# Patient Record
Sex: Male | Born: 1935 | Hispanic: Yes | Marital: Single | Smoking: Former smoker
Health system: Southern US, Community
[De-identification: ages and names within clinical notes are randomized; demographics above are authoritative.]

## PROBLEM LIST (undated history)

## (undated) DIAGNOSIS — K56609 Unspecified intestinal obstruction, unspecified as to partial versus complete obstruction: Secondary | ICD-10-CM

## (undated) DIAGNOSIS — H409 Unspecified glaucoma: Secondary | ICD-10-CM

## (undated) DIAGNOSIS — N183 Chronic kidney disease, stage 3 unspecified: Secondary | ICD-10-CM

## (undated) HISTORY — PX: PROSTATE SURGERY: SHX751

## (undated) HISTORY — PX: CHOLECYSTECTOMY: SHX55

---

## 2006-10-08 ENCOUNTER — Ambulatory Visit: Payer: Self-pay | Admitting: Family Medicine

## 2009-01-25 ENCOUNTER — Emergency Department: Payer: Self-pay | Admitting: Emergency Medicine

## 2015-10-11 ENCOUNTER — Inpatient Hospital Stay: Payer: Medicare Other

## 2015-10-11 ENCOUNTER — Inpatient Hospital Stay
Admission: EM | Admit: 2015-10-11 | Discharge: 2015-10-14 | DRG: 444 | Disposition: A | Discharge status: Home Health | Payer: Medicare Other | Attending: Internal Medicine | Admitting: Internal Medicine

## 2015-10-11 ENCOUNTER — Encounter: Payer: Self-pay | Admitting: Emergency Medicine

## 2015-10-11 ENCOUNTER — Emergency Department: Payer: Medicare Other

## 2015-10-11 DIAGNOSIS — B179 Acute viral hepatitis, unspecified: Secondary | ICD-10-CM | POA: Diagnosis present

## 2015-10-11 DIAGNOSIS — R935 Abnormal findings on diagnostic imaging of other abdominal regions, including retroperitoneum: Secondary | ICD-10-CM

## 2015-10-11 DIAGNOSIS — N289 Disorder of kidney and ureter, unspecified: Secondary | ICD-10-CM

## 2015-10-11 DIAGNOSIS — K839 Disease of biliary tract, unspecified: Secondary | ICD-10-CM | POA: Insufficient documentation

## 2015-10-11 DIAGNOSIS — R112 Nausea with vomiting, unspecified: Secondary | ICD-10-CM

## 2015-10-11 DIAGNOSIS — R748 Abnormal levels of other serum enzymes: Secondary | ICD-10-CM | POA: Diagnosis not present

## 2015-10-11 DIAGNOSIS — K269 Duodenal ulcer, unspecified as acute or chronic, without hemorrhage or perforation: Secondary | ICD-10-CM

## 2015-10-11 DIAGNOSIS — K859 Acute pancreatitis without necrosis or infection, unspecified: Secondary | ICD-10-CM | POA: Insufficient documentation

## 2015-10-11 DIAGNOSIS — A498 Other bacterial infections of unspecified site: Secondary | ICD-10-CM | POA: Diagnosis present

## 2015-10-11 DIAGNOSIS — Z9049 Acquired absence of other specified parts of digestive tract: Secondary | ICD-10-CM

## 2015-10-11 DIAGNOSIS — K8050 Calculus of bile duct without cholangitis or cholecystitis without obstruction: Principal | ICD-10-CM | POA: Diagnosis present

## 2015-10-11 DIAGNOSIS — R1013 Epigastric pain: Secondary | ICD-10-CM | POA: Insufficient documentation

## 2015-10-11 DIAGNOSIS — A419 Sepsis, unspecified organism: Secondary | ICD-10-CM | POA: Diagnosis present

## 2015-10-11 DIAGNOSIS — R109 Unspecified abdominal pain: Secondary | ICD-10-CM | POA: Diagnosis present

## 2015-10-11 DIAGNOSIS — Z8249 Family history of ischemic heart disease and other diseases of the circulatory system: Secondary | ICD-10-CM

## 2015-10-11 DIAGNOSIS — N179 Acute kidney failure, unspecified: Secondary | ICD-10-CM | POA: Diagnosis present

## 2015-10-11 HISTORY — DX: Unspecified intestinal obstruction, unspecified as to partial versus complete obstruction: K56.609

## 2015-10-11 LAB — CBC WITH DIFFERENTIAL/PLATELET
Basophils Absolute: 0 10*3/uL (ref 0–0.1)
Basophils Relative: 0 %
EOS ABS: 0 10*3/uL (ref 0–0.7)
EOS PCT: 0 %
HEMATOCRIT: 43.3 % (ref 40.0–52.0)
HEMOGLOBIN: 14.4 g/dL (ref 13.0–18.0)
Lymphocytes Relative: 1 %
Lymphs Abs: 0.2 10*3/uL — ABNORMAL LOW (ref 1.0–3.6)
MCH: 30.7 pg (ref 26.0–34.0)
MCHC: 33.2 g/dL (ref 32.0–36.0)
MCV: 92.5 fL (ref 80.0–100.0)
MONO ABS: 0.2 10*3/uL (ref 0.2–1.0)
Monocytes Relative: 2 %
Neutro Abs: 11.8 10*3/uL — ABNORMAL HIGH (ref 1.4–6.5)
Neutrophils Relative %: 97 %
Platelets: 199 10*3/uL (ref 150–440)
RBC: 4.68 MIL/uL (ref 4.40–5.90)
RDW: 14.2 % (ref 11.5–14.5)
WBC: 12.2 10*3/uL — ABNORMAL HIGH (ref 3.8–10.6)

## 2015-10-11 LAB — TSH: TSH: 1.798 u[IU]/mL (ref 0.350–4.500)

## 2015-10-11 LAB — COMPREHENSIVE METABOLIC PANEL
ALK PHOS: 534 U/L — AB (ref 38–126)
ALT: 241 U/L — AB (ref 17–63)
ANION GAP: 9 (ref 5–15)
AST: 288 U/L — ABNORMAL HIGH (ref 15–41)
Albumin: 3.5 g/dL (ref 3.5–5.0)
BILIRUBIN TOTAL: 3 mg/dL — AB (ref 0.3–1.2)
BUN: 40 mg/dL — ABNORMAL HIGH (ref 6–20)
CALCIUM: 8.9 mg/dL (ref 8.9–10.3)
CO2: 23 mmol/L (ref 22–32)
CREATININE: 2.22 mg/dL — AB (ref 0.61–1.24)
Chloride: 109 mmol/L (ref 101–111)
GFR, EST AFRICAN AMERICAN: 31 mL/min — AB (ref >60)
GFR, EST NON AFRICAN AMERICAN: 27 mL/min — AB (ref >60)
Glucose, Bld: 125 mg/dL — ABNORMAL HIGH (ref 65–99)
Potassium: 4 mmol/L (ref 3.5–5.1)
SODIUM: 141 mmol/L (ref 135–145)
TOTAL PROTEIN: 7.7 g/dL (ref 6.5–8.1)

## 2015-10-11 LAB — URINALYSIS COMPLETE WITH MICROSCOPIC (ARMC ONLY)
Bacteria, UA: NONE SEEN
Bilirubin Urine: NEGATIVE
GLUCOSE, UA: NEGATIVE mg/dL
KETONES UR: NEGATIVE mg/dL
Leukocytes, UA: NEGATIVE
Nitrite: NEGATIVE
PROTEIN: 30 mg/dL — AB
SPECIFIC GRAVITY, URINE: 1.011 (ref 1.005–1.030)
SQUAMOUS EPITHELIAL / LPF: NONE SEEN
pH: 5 (ref 5.0–8.0)

## 2015-10-11 LAB — HEMOGLOBIN A1C: Hgb A1c MFr Bld: 5.3 % (ref 4.0–6.0)

## 2015-10-11 LAB — GAMMA GT: GGT: 827 U/L — ABNORMAL HIGH (ref 7–50)

## 2015-10-11 LAB — TROPONIN I

## 2015-10-11 MED ORDER — MORPHINE SULFATE (PF) 4 MG/ML IV SOLN
4.0000 mg | INTRAVENOUS | Status: DC | PRN
  Administered 2015-10-11: 4 mg via INTRAVENOUS
  Filled 2015-10-11: qty 1

## 2015-10-11 MED ORDER — SODIUM CHLORIDE 0.9 % IV BOLUS (SEPSIS)
1000.0000 mL | Freq: Once | INTRAVENOUS | Status: AC
  Administered 2015-10-11: 1000 mL via INTRAVENOUS

## 2015-10-11 MED ORDER — DEXTROSE 5 % IV SOLN
1.0000 g | Freq: Once | INTRAVENOUS | Status: AC
  Administered 2015-10-11: 1 g via INTRAVENOUS
  Filled 2015-10-11: qty 10

## 2015-10-11 MED ORDER — ONDANSETRON HCL 4 MG/2ML IJ SOLN
INTRAMUSCULAR | Status: AC
  Administered 2015-10-11: 4 mg via INTRAVENOUS
  Filled 2015-10-11: qty 2

## 2015-10-11 MED ORDER — MORPHINE SULFATE (PF) 2 MG/ML IV SOLN
INTRAVENOUS | Status: AC
  Administered 2015-10-11: 2 mg via INTRAVENOUS
  Filled 2015-10-11: qty 1

## 2015-10-11 MED ORDER — SODIUM CHLORIDE 0.9 % IJ SOLN
3.0000 mL | Freq: Two times a day (BID) | INTRAMUSCULAR | Status: DC
  Administered 2015-10-13: 3 mL via INTRAVENOUS

## 2015-10-11 MED ORDER — SODIUM CHLORIDE 0.9 % IV SOLN
INTRAVENOUS | Status: DC
  Administered 2015-10-11: 11:00:00 via INTRAVENOUS
  Administered 2015-10-11: 1000 mL via INTRAVENOUS
  Administered 2015-10-12 – 2015-10-13 (×5): via INTRAVENOUS

## 2015-10-11 MED ORDER — DOCUSATE SODIUM 100 MG PO CAPS
100.0000 mg | ORAL_CAPSULE | Freq: Two times a day (BID) | ORAL | Status: DC
  Administered 2015-10-11 – 2015-10-14 (×5): 100 mg via ORAL
  Filled 2015-10-11 (×5): qty 1

## 2015-10-11 MED ORDER — ONDANSETRON HCL 4 MG/2ML IJ SOLN
4.0000 mg | Freq: Four times a day (QID) | INTRAMUSCULAR | Status: DC | PRN
  Administered 2015-10-13: 4 mg via INTRAVENOUS
  Filled 2015-10-11: qty 2

## 2015-10-11 MED ORDER — DEXTROSE 5 % IV SOLN
2.0000 g | INTRAVENOUS | Status: DC
  Administered 2015-10-12: 2 g via INTRAVENOUS
  Filled 2015-10-11 (×2): qty 2

## 2015-10-11 MED ORDER — MORPHINE SULFATE (PF) 2 MG/ML IV SOLN
2.0000 mg | Freq: Once | INTRAVENOUS | Status: AC
Start: 2015-10-11 — End: 2015-10-11
  Administered 2015-10-11: 2 mg via INTRAVENOUS

## 2015-10-11 MED ORDER — HEPARIN SODIUM (PORCINE) 5000 UNIT/ML IJ SOLN
5000.0000 [IU] | Freq: Three times a day (TID) | INTRAMUSCULAR | Status: DC
  Administered 2015-10-11: 5000 [IU] via SUBCUTANEOUS
  Filled 2015-10-11: qty 1

## 2015-10-11 MED ORDER — DEXTROSE 5 % IV SOLN
1.0000 g | INTRAVENOUS | Status: DC
  Administered 2015-10-11: 1 g via INTRAVENOUS
  Filled 2015-10-11: qty 10

## 2015-10-11 MED ORDER — ONDANSETRON HCL 4 MG/2ML IJ SOLN
4.0000 mg | Freq: Once | INTRAMUSCULAR | Status: AC
  Administered 2015-10-11: 4 mg via INTRAVENOUS

## 2015-10-11 MED ORDER — ONDANSETRON HCL 4 MG PO TABS: 4.0000 mg | ORAL_TABLET | Freq: Four times a day (QID) | ORAL | Status: DC | PRN

## 2015-10-11 NOTE — Consult Note (Signed)
[facility]  [address] [place], [place] [address] Phone: [phone] Fax : [phone]  Consultation  Referring Provider:     No ref. provider found Primary Care Physician:  Imelda PillowHOLLAND, CHELSA, NP Primary Gastroenterologist:  None         Reason for Consultation:     Epigastric pain with increased liver enzymes and pancreatic enzymes  Date of Admission:  10/11/2015 Date of Consultation:  10/11/2015         HPI:   William Holden is a 80 y.o. male who comes in with epigastric pain with nausea vomiting just prior to admission. The patient was found to have increased liver enzymes and increased lipase. The patient's lipase was 823 with the patient's liver enzymes showing a bilirubin of 3 with his AST higher than his ALT. The patient was asked if he had a history of drinking and he reported very little drinking but the wife interjected and states that it was more than a little. The patient then pushed back and the wife states that he was left alone for many years without a family while they were back in [place] and at that time he may have been drinking more than usual. The patient had signs of liver disease with possible portal hypertension on the CT scan. The patient has been having the pain off and on his epigastric region but less than yesterday. He also was put on pancreatic enzymes by his primary care physician which he thought was due to help his stomach. He denies a history of pancreatic insufficiency despite being on pancreas enzymes.  Past Medical History  Diagnosis Date  . Bowel obstruction [facility](HCC)     Past Surgical History  Procedure Laterality Date  . Cholecystectomy      Prior to Admission medications   Not on File    Family History  Problem Relation Age of Onset  . Hypertension Other      Social History  Substance Use Topics  . Smoking status: Never Smoker   . Smokeless tobacco: None  . Alcohol Use: No    Allergies as of 10/11/2015  .  (No Known Allergies)    Review of Systems:    All systems reviewed and negative except where noted in HPI.   Physical Exam:  Vital signs in last 24 hours: Temp:  [98.4 F (36.9 C)-99.8 F (37.7 C)] 98.4 F (36.9 C) (12/03 1012) Pulse Rate:  [85-101] 85 (12/03 1012) Resp:  [23-27] 23 (12/03 0700) BP: (111-123)/(64-87) 111/64 mmHg (12/03 1012) SpO2:  [93 %-95 %] 95 % (12/03 1012) Weight:  [130 lb (58.968 kg)] 130 lb (58.968 kg) (12/03 0646)   General:   Pleasant, cooperative in NAD Head:  Normocephalic and atraumatic. Eyes:   No icterus.   Conjunctiva pink. PERRLA. Ears:  Normal auditory acuity. Neck:  Supple; no masses or thyroidomegaly Lungs: Respirations even and unlabored. Lungs clear to auscultation bilaterally.   No wheezes, crackles, or rhonchi.  Heart:  Regular rate and rhythm;  Without murmur, clicks, rubs or gallops Abdomen:  Soft, nondistended, mild epigastric tenderness. Normal bowel sounds. No appreciable masses or hepatomegaly.  No rebound or guarding.  Rectal:  Not performed. Msk:  Symmetrical without gross deformities.   Extremities:  Without edema, cyanosis or clubbing. Neurologic:  Alert and oriented x3;  grossly normal neurologically. Skin:  Intact without significant lesions or rashes. Cervical Nodes:  No significant cervical adenopathy. Psych:  Alert and cooperative. Normal affect.  LAB RESULTS:  Recent  Labs  10/11/15 0516  WBC 12.2*  HGB 14.4  HCT 43.3  PLT 199   BMET  Recent Labs  10/11/15 0516  NA 141  K 4.0  CL 109  CO2 23  GLUCOSE 125*  BUN 40*  CREATININE 2.22*  CALCIUM 8.9   LFT  Recent Labs  10/11/15 0516  PROT 7.7  ALBUMIN 3.5  AST 288*  ALT 241*  ALKPHOS 534*  BILITOT 3.0*   PT/INR No results for input(s): LABPROT, INR in the last 72 hours.  STUDIES: US Abdomen Complete  10/11/2015  CLINICAL DATA:  Abdominal pain and pancreatitis with elevated lipase level. Abnormal liver function tests. EXAM: ULTRASOUND ABDOMEN  COMPLETE COMPARISON:  None. FINDINGS: Gallbladder: The gallbladder is not visualized by ultrasound and is either severely contracted or previously surgically removed. Common bile duct: Diameter: Visualize segment of the common bile duct is at the upper limits of normal in caliber measuring 7 mm. The distal common bile duct is obscured by ultrasound. Liver: The liver parenchyma is very heterogeneous with suggestion of mild intrahepatic biliary ductal dilatation as well as diffuse periportal echogenicity likely reflecting inflammation. Differential considerations include some type of acute hepatitis. There could also be a component of cholangitis. However, the degree of biliary ductal dilatation is only minimal by ultrasound. Some degree of portal hypertension may also be present as there appear to be slow flow in the main portal vein by real-time imaging. The portal vein is open and demonstrates flow. IVC: No abnormality visualized. Pancreas: Visualized portion unremarkable. Spleen: Size and appearance within normal limits. Right Kidney: Length: 10.7 cm. Echogenicity within normal limits. No mass or hydronephrosis visualized. Small cysts appear benign. The largest measures 1.4 cm. Left Kidney: Length: 10.1 cm. Echogenicity within normal limits. No mass or hydronephrosis visualized. Medial cystic structure measures 3.3 x 1.8 x 2.5 cm and likely represents a benign cyst. Abdominal aorta: No aneurysm visualized. Other findings: No ascites visualized. IMPRESSION: 1. Nonvisualization of the gallbladder by ultrasound. The gallbladder has either been previously removed or is severely contracted. 2. Very abnormal appearance of the liver parenchyma by ultrasound with pattern suggestive of diffuse periportal edema and inflammation as well as mild biliary ductal dilatation. Findings may be consistent with acute hepatitis versus cholangitis. The degree of biliary ductal dilatation is mild. There may be underlying portal  hypertension as well due to visible slow flow in the main portal vein by real-time imaging. No associated splenomegaly or visualized ascites. Electronically Signed   By: Irish Lack M.D.   On: 10/11/2015 10:27   Dg Abd Acute W/chest  10/11/2015  CLINICAL DATA:  Abdominal pain and vomiting, duration 1 hour. EXAM: DG ABDOMEN ACUTE W/ 1V CHEST COMPARISON:  None. FINDINGS: The abdominal gas pattern negative for obstruction perforation. No biliary or urinary calculi are evident. The upright view the chest demonstrates mild atelectatic appearing basilar lung opacities bilaterally. IMPRESSION: Negative for obstruction or perforation. Atelectatic appearing lung base opacities bilaterally. Electronically Signed   By: Ellery Plunk M.D.   On: 10/11/2015 06:46      Impression / Plan:   William Holden is a 80 y.o. y/o male with increased liver enzymes and increased lipase. The patient also had a mildly dilated common bile duct. With this and they possible history of alcohol abuse the patient may have acute on chronic liver disease versus a possible common bile duct stone. The patient will have his labs checked tomorrow and will also have a MRCP to evaluate his common  bile duct for possible biliary stone. The patient and the family have been explained all of this through an interpreter. They state they understand the plan and agree with it.   Thank you for involving me in the care of this patient.      LOS: 0 days   Darlina Rumpf, MD  10/11/2015, 1:25 PM   Note: This dictation was prepared with Dragon dictation along with smaller phrase technology. Any transcriptional errors that result from this process are unintentional.

## 2015-10-11 NOTE — ED Provider Notes (Signed)
[facility] Emergency Department Provider Note  ____________________________________________  Time seen: Approximately 4:57 AM  I have reviewed the triage vital signs and the nursing notes.   HISTORY  Chief Complaint Abdominal Pain and Emesis  History obtained via Spanish interpreter  HPI William Holden is a 80 y.o. male who presents to the ED from home with a chief complaint of abdominal pain, nausea and vomiting. Patient states onset of epigastric abdominal pain associated with nausea and vomiting which started approximately 1 hour ago. Family states he was treated 1 month ago in [place]; "they cleaned his intestines with a tube in his mouth".Per their report, patient did not require open surgery at that time. Patient denies associated symptoms of fever, chills, chest pain, shortness of breath, diarrhea, dysuria. States last bowel movement last evening which was normal for patient. The makes his symptoms better or worse. Other than that, history is limited because family does not know.   Past Medical History  Diagnosis Date  . Bowel obstruction (HCC)     There are no active problems to display for this patient.   Past surgical history Cholecystectomy   No current outpatient prescriptions on file.  Allergies Review of patient's allergies indicates no known allergies.  No family history on file.  Social History Social History  Substance Use Topics  . Smoking status: Never Smoker   . Smokeless tobacco: None  . Alcohol Use: No    Review of Systems Constitutional: No fever/chills Eyes: No visual changes. ENT: No sore throat. Cardiovascular: Denies chest pain. Respiratory: Denies shortness of breath. Gastrointestinal: Positive for abdominal pain.  Positive for nausea and vomiting.  No diarrhea.  No constipation. Genitourinary: Negative for dysuria. Musculoskeletal: Negative for back pain. Skin: Negative for rash. Neurological: Negative  for headaches, focal weakness or numbness.  10-point ROS otherwise negative.  ____________________________________________   PHYSICAL EXAM:  VITAL SIGNS: ED Triage Vitals  Enc Vitals Group     BP 10/11/15 0442 123/87 mmHg     Pulse Rate 10/11/15 0442 101     Resp 10/11/15 0442 24     Temp 10/11/15 0442 99.8 F (37.7 C)     Temp Source 10/11/15 0442 Oral     SpO2 10/11/15 0442 93 %     Weight --      Height 10/11/15 0444 5\' 4"  (1.626 m)     Head Cir --      Peak Flow --      Pain Score 10/11/15 0448 10     Pain Loc --      Pain Edu? --      Excl. in GC? --     Constitutional: Alert and oriented. Well appearing and in moderate acute distress. Eyes: Conjunctivae are normal. PERRL. EOMI. Head: Atraumatic. Nose: No congestion/rhinnorhea. Mouth/Throat: Mucous membranes are moist.  Oropharynx non-erythematous. Neck: No stridor.   Cardiovascular: Normal rate, regular rhythm. Grossly normal heart sounds.  Good peripheral circulation. Respiratory: Normal respiratory effort.  No retractions. Lungs CTAB. Gastrointestinal: Soft and diffusely tender to palpation, maximally in epigastrium. No distention. No abdominal bruits. No CVA tenderness. Musculoskeletal: No lower extremity tenderness nor edema.  No joint effusions. Neurologic:  Normal speech and language. No gross focal neurologic deficits are appreciated. No gait instability. Skin:  Skin is warm, dry and intact. No rash noted. Psychiatric: Mood and affect are normal. Speech and behavior are normal.  ____________________________________________   LABS (all labs ordered are listed, but only abnormal results are displayed)  Labs  Reviewed  CBC WITH DIFFERENTIAL/PLATELET - Abnormal; Notable for the following:    WBC 12.2 (*)    Neutro Abs 11.8 (*)    Lymphs Abs 0.2 (*)    All other components within normal limits  COMPREHENSIVE METABOLIC PANEL - Abnormal; Notable for the following:    Glucose, Bld 125 (*)    BUN 40 (*)     Creatinine, Ser 2.22 (*)    AST 288 (*)    ALT 241 (*)    Alkaline Phosphatase 534 (*)    Total Bilirubin 3.0 (*)    GFR calc non Af Amer 27 (*)    GFR calc Af Amer 31 (*)    All other components within normal limits  LIPASE, BLOOD - Abnormal; Notable for the following:    Lipase 823 (*)    All other components within normal limits  TROPONIN I  URINALYSIS COMPLETEWITH MICROSCOPIC ([facility] ONLY)   ____________________________________________  EKG  ED ECG REPORT I, Kilani Joffe J, the attending physician, personally viewed and interpreted this ECG.   Date: 10/11/2015  EKG Time: 0513  Rate: 97  Rhythm: normal EKG, normal sinus rhythm  Axis: Normal  Intervals:none  ST&T Change: Nonspecific  ____________________________________________  RADIOLOGY  Acute abdominal series (view by me, interpreted per Dr. Clovis Riley): Negative for obstruction or perforation. Atelectatic appearing lung base opacities bilaterally. ____________________________________________   PROCEDURES  Procedure(s) performed: None  Critical Care performed: No  ____________________________________________   INITIAL IMPRESSION / ASSESSMENT AND PLAN / ED COURSE  Pertinent labs & imaging results that were available during my care of the patient were reviewed by me and considered in my medical decision making (see chart for details).  80 year old male who presents with abdominal pain, nausea and vomiting. Per report, sounds like patient had a bowel obstruction approximately one month ago in [place] which was nonoperative. Will check screening lab work, obtain abdominal x-ray series, administer IV analgesia and antiemetic, start IV fluid resuscitation and consider CT abdomen/pelvis if x-rays are unremarkable.  ----------------------------------------- 6:35 AM on 10/11/2015 -----------------------------------------  Elevated LFTs and lipase noted. Discussed with hospitalist to evaluate patient in the emergency  department for admission. ____________________________________________   FINAL CLINICAL IMPRESSION(S) / ED DIAGNOSES  Final diagnoses:  Epigastric pain  Non-intractable vomiting with nausea, vomiting of unspecified type  Acute pancreatitis, unspecified pancreatitis type  Elevated liver enzymes  Renal insufficiency      Irean Hong, MD 10/11/15 [phone]

## 2015-10-11 NOTE — H&P (Addendum)
William Holden is an 80 y.o. male.   Chief Complaint: Abdominal pain HPI: The patient presents emergency department complaining of abdominal pain that began approximately an hour prior to presentation. The patient points to his upper abdomen and reports vomiting. The family states that he had broccoli for dinner and his emesis was green. Notably, the patient had been admitted to the hospital in [place] a few weeks ago for what sounds like a bowel obstruction. At that time the family reports that the "tube was in his nose for 3 days" and decompressed his bowel (by description there was no fluid filling the canister which it was attached during part of that time). Since that time the patient has developed diarrhea (likely in part due to his prescription for Kayexalate) but also subjective fevers and rigors. In the emergency department the patient was found to have elevated lipase as well as hepatitis. Due to his acute findings emergency department staff called for admission.  Past Medical History  Diagnosis Date  . Bowel obstruction [facility])     Past Surgical History  Procedure Laterality Date  . Cholecystectomy      Family History  Problem Relation Age of Onset  . Hypertension Other    Social History:  reports that he has never smoked. He does not have any smokeless tobacco history on file. He reports that he does not drink alcohol. His drug history is not on file.  Allergies: No Known Allergies  MEDS: pancreatic enzyme and kayexelate (Rx from [place] recently)  Results for orders placed or performed during the hospital encounter of 10/11/15 (from the past 48 hour(s))  Gamma GT     Status: Abnormal   Collection Time: 10/10/15  7:00 AM  Result Value Ref Range   GGT 827 (H) 7 - 50 U/L  CBC with Differential     Status: Abnormal   Collection Time: 10/11/15  5:16 AM  Result Value Ref Range   WBC 12.2 (H) 3.8 - 10.6 K/uL   RBC 4.68 4.40 - 5.90 MIL/uL   Hemoglobin 14.4 13.0 - 18.0 g/dL    HCT 43.3 40.0 - 52.0 %   MCV 92.5 80.0 - 100.0 fL   MCH 30.7 26.0 - 34.0 pg   MCHC 33.2 32.0 - 36.0 g/dL   RDW 14.2 11.5 - 14.5 %   Platelets 199 150 - 440 K/uL   Neutrophils Relative % 97 %   Neutro Abs 11.8 (H) 1.4 - 6.5 K/uL   Lymphocytes Relative 1 %   Lymphs Abs 0.2 (L) 1.0 - 3.6 K/uL   Monocytes Relative 2 %   Monocytes Absolute 0.2 0.2 - 1.0 K/uL   Eosinophils Relative 0 %   Eosinophils Absolute 0.0 0 - 0.7 K/uL   Basophils Relative 0 %   Basophils Absolute 0.0 0 - 0.1 K/uL  Comprehensive metabolic panel     Status: Abnormal   Collection Time: 10/11/15  5:16 AM  Result Value Ref Range   Sodium 141 135 - 145 mmol/L   Potassium 4.0 3.5 - 5.1 mmol/L   Chloride 109 101 - 111 mmol/L   CO2 23 22 - 32 mmol/L   Glucose, Bld 125 (H) 65 - 99 mg/dL   BUN 40 (H) 6 - 20 mg/dL   Creatinine, Ser 2.22 (H) 0.61 - 1.24 mg/dL   Calcium 8.9 8.9 - 10.3 mg/dL   Total Protein 7.7 6.5 - 8.1 g/dL   Albumin 3.5 3.5 - 5.0 g/dL   AST 288 (H) 15 -  41 U/L   ALT 241 (H) 17 - 63 U/L   Alkaline Phosphatase 534 (H) 38 - 126 U/L   Total Bilirubin 3.0 (H) 0.3 - 1.2 mg/dL   GFR calc non Af Amer 27 (L) >60 mL/min   GFR calc Af Amer 31 (L) >60 mL/min    Comment: (NOTE) The eGFR has been calculated using the CKD EPI equation. This calculation has not been validated in all clinical situations. eGFR's persistently <60 mL/min signify possible Chronic Kidney Disease.    Anion gap 9 5 - 15  Lipase, blood     Status: Abnormal   Collection Time: 10/11/15  5:16 AM  Result Value Ref Range   Lipase 823 (H) 11 - 51 U/L  Troponin I     Status: None   Collection Time: 10/11/15  5:16 AM  Result Value Ref Range   Troponin I <0.03 <0.031 ng/mL    Comment:        NO INDICATION OF MYOCARDIAL INJURY.   Urinalysis complete, with microscopic ([facility] only)     Status: Abnormal   Collection Time: 10/11/15  6:19 AM  Result Value Ref Range   Color, Urine YELLOW (A) YELLOW   APPearance CLEAR (A) CLEAR   Glucose, UA  NEGATIVE NEGATIVE mg/dL   Bilirubin Urine NEGATIVE NEGATIVE   Ketones, ur NEGATIVE NEGATIVE mg/dL   Specific Gravity, Urine 1.011 1.005 - 1.030   Hgb urine dipstick 1+ (A) NEGATIVE   pH 5.0 5.0 - 8.0   Protein, ur 30 (A) NEGATIVE mg/dL   Nitrite NEGATIVE NEGATIVE   Leukocytes, UA NEGATIVE NEGATIVE   RBC / HPF 0-5 0 - 5 RBC/hpf   WBC, UA 0-5 0 - 5 WBC/hpf   Bacteria, UA NONE SEEN NONE SEEN   Squamous Epithelial / LPF NONE SEEN NONE SEEN   Dg Abd Acute W/chest  10/11/2015  CLINICAL DATA:  Abdominal pain and vomiting, duration 1 hour. EXAM: DG ABDOMEN ACUTE W/ 1V CHEST COMPARISON:  None. FINDINGS: The abdominal gas pattern negative for obstruction perforation. No biliary or urinary calculi are evident. The upright view the chest demonstrates mild atelectatic appearing basilar lung opacities bilaterally. IMPRESSION: Negative for obstruction or perforation. Atelectatic appearing lung base opacities bilaterally. Electronically Signed   By: Andreas Newport M.D.   On: 10/11/2015 06:46    Review of Systems  Constitutional: Positive for fever (subjective) and chills.  HENT: Negative for sore throat and tinnitus.   Eyes: Negative for blurred vision and redness.  Respiratory: Negative for cough and shortness of breath.   Cardiovascular: Negative for chest pain, palpitations, orthopnea and PND.  Gastrointestinal: Positive for nausea, vomiting, abdominal pain and diarrhea.  Genitourinary: Negative for dysuria, urgency and frequency.  Musculoskeletal: Negative for myalgias and joint pain.  Skin: Negative for rash.       No lesions  Neurological: Negative for speech change, focal weakness and weakness.  Endo/Heme/Allergies: Does not bruise/bleed easily.       No temperature intolerance  Psychiatric/Behavioral: Negative for depression and suicidal ideas.    Blood pressure 113/74, pulse 94, temperature 99.8 F (37.7 C), temperature source Oral, resp. rate 23, height 5' 4"  (1.626 m), weight 58.968  kg (130 lb), SpO2 93 %. Physical Exam  Nursing note and vitals reviewed. Constitutional: He is oriented to person, place, and time. He appears well-developed and well-nourished. No distress.  HENT:  Head: Normocephalic and atraumatic.  Mouth/Throat: Oropharynx is clear and moist.  Eyes: Conjunctivae and EOM are normal. Pupils are equal,  round, and reactive to light. No scleral icterus.  Neck: Normal range of motion. Neck supple. No JVD present. No tracheal deviation present. No thyromegaly present.  Cardiovascular: Normal rate, regular rhythm and normal heart sounds.  Exam reveals no gallop and no friction rub.   No murmur heard. Respiratory: Effort normal and breath sounds normal.  GI: Soft. Bowel sounds are normal. He exhibits no distension. There is no tenderness.  Genitourinary:  deferred  Musculoskeletal: Normal range of motion. He exhibits no edema.  Lymphadenopathy:    He has no cervical adenopathy.  Neurological: He is alert and oriented to person, place, and time. No cranial nerve deficit.  Skin: Skin is warm and dry.  Psychiatric: He has a normal mood and affect. His behavior is normal. Judgment and thought content normal.     Assessment/Plan This is a 80 year old Hispanic male admitted for acute pancreatitis and acute hepatitis. 1. Acute pancreatitis: With associated bilious emesis per family description. Nonbloody. The patient will be nothing by mouth; aggressively hydrate. Afebrile at this time but leukocytosis is present. Calcium is normal but renal function is poor. Will order imaging of the pancreas and gallbladder to rule out necrosis and choledocholithiasis, respectively. Modified Ranson criteria (Glasgow Imrie) severity equals 3. Gastroenterology consulted 2. Sepsis: The patient intermittently meets criteria via leukocytosis, tachycardia and tachypnea. I have ordered blood cultures as well as gram-negative coverage. He is hemodynamically stable. 3. Acute kidney injury:  Possibly multifactorial including pancreatitis as well as dehydration. Avoid nephrotoxic drugs. Hydrate with intravenous fluid. Worsens morbidity in context of sepsis. If no improvement patient may require dialysis. Nephrology consult at discretion of primary team 4. Acute hepatitis: Rule out infectious causes as well as autoimmune hepatitis. Check GGT as well. 5. DVT prophylaxis: Heparin 6. GI prophylaxis: Pantoprazole The patient is a full code. Time spent on admission and critical patient care approximately 45 minutes  Harrie Foreman 10/11/2015, 7:38 AM

## 2015-10-11 NOTE — ED Notes (Signed)
Dr.diamond called back and asked if pt could get US before going to floor. Charge nurse aware

## 2015-10-11 NOTE — ED Notes (Signed)
Patient transported to Ultrasound 

## 2015-10-11 NOTE — ED Notes (Signed)
Patient transported to X-ray 

## 2015-10-11 NOTE — Progress Notes (Signed)
Received blood culture results from lab. Dr. Anne HahnWillis notified. Continue Rocephin IVPB.

## 2015-10-11 NOTE — Progress Notes (Signed)
Per hospital policy, infections including intra-abdominal infections require higher doses of ceftriaxone. Therefore per protocol, ceftriaxone 1g q 24 was transitioned to ceftriaxone 2g q 24 hours. Pt received 1g this AM around 0800, an additional 1 g will be given this AM to make a total of 2 g.  Melissa D Maccia, Pharm.D Clinical Pharmacist

## 2015-10-11 NOTE — ED Notes (Signed)
Patient with complaint of abd pain and vomiting that started about an hour ago. Patient was treated for an obstruction about one month ago.

## 2015-10-12 ENCOUNTER — Inpatient Hospital Stay: Payer: Medicare Other | Admitting: Anesthesiology

## 2015-10-12 ENCOUNTER — Encounter
Admission: EM | Disposition: A | Discharge status: Home Health | Payer: Self-pay | Source: Home / Self Care | Attending: Internal Medicine

## 2015-10-12 DIAGNOSIS — K839 Disease of biliary tract, unspecified: Secondary | ICD-10-CM | POA: Insufficient documentation

## 2015-10-12 DIAGNOSIS — K8050 Calculus of bile duct without cholangitis or cholecystitis without obstruction: Secondary | ICD-10-CM | POA: Insufficient documentation

## 2015-10-12 HISTORY — PX: ENDOSCOPIC RETROGRADE CHOLANGIOPANCREATOGRAPHY (ERCP) WITH PROPOFOL: SHX5810

## 2015-10-12 LAB — COMPREHENSIVE METABOLIC PANEL
ALK PHOS: 263 U/L — AB (ref 38–126)
ALT: 138 U/L — ABNORMAL HIGH (ref 17–63)
ANION GAP: 4 — AB (ref 5–15)
AST: 110 U/L — ABNORMAL HIGH (ref 15–41)
Albumin: 2.2 g/dL — ABNORMAL LOW (ref 3.5–5.0)
BUN: 38 mg/dL — ABNORMAL HIGH (ref 6–20)
CALCIUM: 7.1 mg/dL — AB (ref 8.9–10.3)
CO2: 19 mmol/L — AB (ref 22–32)
Chloride: 117 mmol/L — ABNORMAL HIGH (ref 101–111)
Creatinine, Ser: 1.84 mg/dL — ABNORMAL HIGH (ref 0.61–1.24)
GFR calc non Af Amer: 34 mL/min — ABNORMAL LOW (ref >60)
GFR, EST AFRICAN AMERICAN: 39 mL/min — AB (ref >60)
Glucose, Bld: 85 mg/dL (ref 65–99)
POTASSIUM: 4.1 mmol/L (ref 3.5–5.1)
SODIUM: 140 mmol/L (ref 135–145)
TOTAL PROTEIN: 5.5 g/dL — AB (ref 6.5–8.1)
Total Bilirubin: 3.1 mg/dL — ABNORMAL HIGH (ref 0.3–1.2)

## 2015-10-12 LAB — HEPATITIS PANEL, ACUTE
HEP A IGM: NEGATIVE
HEP B S AG: NEGATIVE
Hep B C IgM: NEGATIVE

## 2015-10-12 LAB — LIPASE, BLOOD
LIPASE: 823 U/L — AB (ref 11–51)
LIPASE: 35 U/L (ref 11–51)

## 2015-10-12 SURGERY — ENDOSCOPIC RETROGRADE CHOLANGIOPANCREATOGRAPHY (ERCP) WITH PROPOFOL
Anesthesia: General

## 2015-10-12 MED ORDER — INDOMETHACIN 50 MG RE SUPP
100.0000 mg | Freq: Once | RECTAL | Status: AC
  Administered 2015-10-12: 100 mg via RECTAL
  Filled 2015-10-12: qty 2

## 2015-10-12 MED ORDER — FENTANYL CITRATE (PF) 100 MCG/2ML IJ SOLN
INTRAMUSCULAR | Status: DC | PRN
  Administered 2015-10-12 (×2): 50 ug via INTRAVENOUS

## 2015-10-12 MED ORDER — ONDANSETRON HCL 4 MG/2ML IJ SOLN: 4.0000 mg | Freq: Once | INTRAMUSCULAR | Status: DC | PRN

## 2015-10-12 MED ORDER — SUCCINYLCHOLINE CHLORIDE 20 MG/ML IJ SOLN
INTRAMUSCULAR | Status: DC | PRN
  Administered 2015-10-12: 80 mg via INTRAVENOUS

## 2015-10-12 MED ORDER — SODIUM CHLORIDE 0.9 % IV BOLUS (SEPSIS)
1000.0000 mL | Freq: Once | INTRAVENOUS | Status: AC
  Administered 2015-10-12: 1000 mL via INTRAVENOUS

## 2015-10-12 MED ORDER — LACTATED RINGERS IV SOLN
INTRAVENOUS | Status: DC | PRN
  Administered 2015-10-12: 13:00:00 via INTRAVENOUS

## 2015-10-12 MED ORDER — ONDANSETRON HCL 4 MG/2ML IJ SOLN
INTRAMUSCULAR | Status: DC | PRN
  Administered 2015-10-12: 4 mg via INTRAVENOUS

## 2015-10-12 MED ORDER — PHENYLEPHRINE HCL 10 MG/ML IJ SOLN
INTRAMUSCULAR | Status: DC | PRN
  Administered 2015-10-12 (×2): 100 ug via INTRAVENOUS

## 2015-10-12 MED ORDER — PROPOFOL 10 MG/ML IV BOLUS
INTRAVENOUS | Status: DC | PRN
  Administered 2015-10-12: 100 mg via INTRAVENOUS

## 2015-10-12 MED ORDER — FENTANYL CITRATE (PF) 100 MCG/2ML IJ SOLN: 25.0000 ug | INTRAMUSCULAR | Status: DC | PRN

## 2015-10-12 NOTE — Progress Notes (Addendum)
Patient taken off floor and transported by transport tech to endoscopy. Dr. Servando SnareWohl in this morning with interpreter and gave informed consent for ERCP this AM. Patient alert and oriented, ambulates well with minimal assistance. Denies pain at this time. IV infusing without s/s of infiltration or infection. Medical Interpreter called to meet patient in Endoscopy for interpretation. Report given to Endoscopy nurse.

## 2015-10-12 NOTE — Transfer of Care (Signed)
Immediate Anesthesia Transfer of Care Note  Patient: William Holden  Procedure(s) Performed: Procedure(s): ENDOSCOPIC RETROGRADE CHOLANGIOPANCREATOGRAPHY (ERCP) WITH PROPOFOL (N/A)  Patient Location: PACU  Anesthesia Type:General  Level of Consciousness: awake  Airway & Oxygen Therapy: Patient Spontanous Breathing and Patient connected to face mask oxygen  Post-op Assessment: Report given to RN  Post vital signs: Reviewed  Last Vitals:  Filed Vitals:   10/12/15 0624 10/12/15 1407  BP: 107/60 115/73  Pulse: 70 64  Temp: 36.8 C 37.5 C  Resp: 19 20    Complications: No apparent anesthesia complications

## 2015-10-12 NOTE — Progress Notes (Signed)
Pt's BP 78/44, HR 70 at 2116. MD notified and orders given. Total of 3L NS bolus given. BP now 106/65, HR 70. Bilateral lung sounds clear, O2 sat 99 on room air. MD updated. Continuing NS @ 150. Will continue to assess.

## 2015-10-12 NOTE — Anesthesia Procedure Notes (Signed)
Procedure Name: Intubation Performed by: Jake Fuhrmann Pre-anesthesia Checklist: Patient identified, Patient being monitored, Timeout performed, Emergency Drugs available and Suction available Patient Re-evaluated:Patient Re-evaluated prior to inductionOxygen Delivery Method: Circle system utilized Preoxygenation: Pre-oxygenation with 100% oxygen Intubation Type: IV induction and Rapid sequence Laryngoscope Size: Miller and 2 Grade View: Grade I Tube type: Oral Tube size: 7.0 mm Number of attempts: 1 Placement Confirmation: ETT inserted through vocal cords under direct vision,  positive ETCO2 and breath sounds checked- equal and bilateral Secured at: 21 cm Tube secured with: Tape Dental Injury: Teeth and Oropharynx as per pre-operative assessment        

## 2015-10-12 NOTE — OR Nursing (Signed)
Patient resting interpretor requested

## 2015-10-12 NOTE — Progress Notes (Signed)
[facility] [place] [facility]   PATIENT NAME: William ReekCrisanto Dimas Holden    MR#:  [id number]  DATE OF BIRTH:  23-Feb-1935  SUBJECTIVE:  Via interpreter came in with vomiting . Found to have cholestatic jaundice REVIEW OF SYSTEMS:   Review of Systems  Constitutional: Negative for fever, chills and weight loss.  HENT: Negative for ear discharge, ear pain and nosebleeds.   Eyes: Negative for blurred vision, pain and discharge.  Respiratory: Negative for sputum production, shortness of breath, wheezing and stridor.   Cardiovascular: Negative for chest pain, palpitations, orthopnea and PND.  Gastrointestinal: Positive for nausea, vomiting and abdominal pain. Negative for diarrhea.  Genitourinary: Negative for urgency and frequency.  Musculoskeletal: Negative for back pain and joint pain.  Neurological: Negative for sensory change, speech change, focal weakness and weakness.  Psychiatric/Behavioral: Negative for depression and hallucinations. The patient is not nervous/anxious.   All other systems reviewed and are negative.  Tolerating Diet:npo Tolerating PT: not needed  DRUG ALLERGIES:  No Known Allergies  VITALS:  Blood pressure 97/61, pulse 57, temperature 98.3 F (36.8 C), temperature source Oral, resp. rate 17, height 5\' 4"  (1.626 m), weight 125 lb 3.2 oz (56.79 kg), SpO2 96 %.  PHYSICAL EXAMINATION:   Physical Exam  GENERAL:  80 y.o.-year-old patient lying in the bed with no acute distress.  EYES: Pupils equal, round, reactive to light and accommodation. No scleral icterus. Extraocular muscles intact.  HEENT: Head atraumatic, normocephalic. Oropharynx and nasopharynx clear.  NECK:  Supple, no jugular venous distention. No thyroid enlargement, no tenderness.  LUNGS: Normal breath sounds bilaterally, no wheezing, rales, rhonchi. No use of accessory muscles of respiration.  CARDIOVASCULAR: S1, S2 normal. No murmurs, rubs, or gallops.  ABDOMEN: Soft,  nontender, nondistended. Bowel sounds present. No organomegaly or mass.  EXTREMITIES: No cyanosis, clubbing or edema b/l.    NEUROLOGIC: Cranial nerves II through XII are intact. No focal Motor or sensory deficits b/l.   PSYCHIATRIC: The patient is alert and oriented x 3.  SKIN: No obvious rash, lesion, or ulcer.    LABORATORY PANEL:   CBC  Recent Labs Lab 10/11/15 0516  WBC 12.2*  HGB 14.4  HCT 43.3  PLT 199    Chemistries   Recent Labs Lab 10/12/15 0629  NA 140  K 4.1  CL 117*  CO2 19*  GLUCOSE 85  BUN 38*  CREATININE 1.84*  CALCIUM 7.1*  AST 110*  ALT 138*  ALKPHOS 263*  BILITOT 3.1*    Cardiac Enzymes  Recent Labs Lab 10/11/15 0516  TROPONINI <0.03    RADIOLOGY:  [place] Abdomen Complete  10/11/2015  CLINICAL DATA:  Abdominal pain and pancreatitis with elevated lipase level. Abnormal liver function tests. EXAM: ULTRASOUND ABDOMEN COMPLETE COMPARISON:  None. FINDINGS: Gallbladder: The gallbladder is not visualized by ultrasound and is either severely contracted or previously surgically removed. Common bile duct: Diameter: Visualize segment of the common bile duct is at the upper limits of normal in caliber measuring 7 mm. The distal common bile duct is obscured by ultrasound. Liver: The liver parenchyma is very heterogeneous with suggestion of mild intrahepatic biliary ductal dilatation as well as diffuse periportal echogenicity likely reflecting inflammation. Differential considerations include some type of acute hepatitis. There could also be a component of cholangitis. However, the degree of biliary ductal dilatation is only minimal by ultrasound. Some degree of portal hypertension may also be present as there appear to be slow flow in the main portal vein by  real-time imaging. The portal vein is open and demonstrates flow. IVC: No abnormality visualized. Pancreas: Visualized portion unremarkable. Spleen: Size and appearance within normal limits. Right Kidney:  Length: 10.7 cm. Echogenicity within normal limits. No mass or hydronephrosis visualized. Small cysts appear benign. The largest measures 1.4 cm. Left Kidney: Length: 10.1 cm. Echogenicity within normal limits. No mass or hydronephrosis visualized. Medial cystic structure measures 3.3 x 1.8 x 2.5 cm and likely represents a benign cyst. Abdominal aorta: No aneurysm visualized. Other findings: No ascites visualized. IMPRESSION: 1. Nonvisualization of the gallbladder by ultrasound. The gallbladder has either been previously removed or is severely contracted. 2. Very abnormal appearance of the liver parenchyma by ultrasound with pattern suggestive of diffuse periportal edema and inflammation as well as mild biliary ductal dilatation. Findings may be consistent with acute hepatitis versus cholangitis. The degree of biliary ductal dilatation is mild. There may be underlying portal hypertension as well due to visible slow flow in the main portal vein by real-time imaging. No associated splenomegaly or visualized ascites. Electronically Signed   By: Irish Lack M.D.   On: 10/11/2015 10:27   Mr Abdomen Mrcp Wo Cm  10/11/2015  CLINICAL DATA:  Abdominal pain. Pancreatitis with elevated lipase and liver function studies. Abnormal appearance of the liver on ultrasound. EXAM: MRI ABDOMEN WITHOUT CONTRAST  (INCLUDING MRCP) TECHNIQUE: Multiplanar multisequence MR imaging of the abdomen was performed. Heavily T2-weighted images of the biliary and pancreatic ducts were obtained, and three-dimensional MRCP images were rendered by post processing. COMPARISON:  Ultrasound earlier the same date. FINDINGS: Study is mildly motion degraded.  No contrast was administered. Lower chest: Mild atelectasis at both lung bases. No significant pleural effusion. Hepatobiliary: No focal hepatic abnormalities are identified on noncontrast imaging. No normal gallbladder is identified. I believe there is a mildly dilated cystic duct remnant status  post presumed cholecystectomy. There is mild intra and extrahepatic biliary dilatation. The common bile duct measures up to 12 mm in diameter. There is a large calculus in the distal common bile duct. This demonstrates low T2 and high T1 signal and may be calcified. It measures at least 1.7 cm on coronal image 12 of series 16. No other intraductal calculi seen. Pancreas: Unremarkable. No pancreatic ductal dilatation or surrounding inflammation. No evidence of pancreas divisum. Spleen: Normal in size without focal abnormality. Adrenals/Urinary Tract: Both adrenal glands appear normal. There are small renal cysts bilaterally. There is nonspecific perinephric soft tissue stranding bilaterally. No hydronephrosis. Stomach/Bowel: No evidence of bowel wall thickening, distention or surrounding inflammatory change. Vascular/Lymphatic: There are no enlarged abdominal lymph nodes. No significant vascular findings are seen. Other: None. Musculoskeletal: No acute or significant osseous findings. Mild lumbar spondylosis. IMPRESSION: 1. Large obstructing calculus in the distal common bile duct with associated biliary dilatation. ERCP recommended. 2. The gallbladder is not clearly seen status post presumed cholecystectomy. The cystic duct remnant appears mildly dilated. Evaluation limited by breathing artifact. 3. No evidence of pancreatitis or pancreatic ductal dilatation. Electronically Signed   By: Carey Bullocks M.D.   On: 10/11/2015 18:51   Dg Abd Acute W/chest  10/11/2015  CLINICAL DATA:  Abdominal pain and vomiting, duration 1 hour. EXAM: DG ABDOMEN ACUTE W/ 1V CHEST COMPARISON:  None. FINDINGS: The abdominal gas pattern negative for obstruction perforation. No biliary or urinary calculi are evident. The upright view the chest demonstrates mild atelectatic appearing basilar lung opacities bilaterally. IMPRESSION: Negative for obstruction or perforation. Atelectatic appearing lung base opacities bilaterally.  Electronically Signed  By: Ellery Plunk M.D.   On: 10/11/2015 06:46     ASSESSMENT AND PLAN:  80 year old Hispanic male admitted for acute pancreatitis and acute hepatitis. 1. Acute pancreatitis: suspected due to cholestatic jaundice -NPO -IVF -Seen by GI recommends MRCP  2. Sepsis: The patient intermittently meets criteria via leukocytosis, tachycardia and tachypnea. -BC neg Sepsis resolved  3. Acute kidney injury: Possibly multifactorial including pancreatitis as well as dehydration.  Avoid nephrotoxic drugs.  Hydrate with intravenous fluid.  4. Acute hepatitis: Rule out infectious causes as well as autoimmune hepatitis Hepatitis panel.  5. DVT prophylaxis: Heparin  6. GI prophylaxis: Pantoprazole  Case discussed with Care Management/Social Worker. Management plans discussed with the patient, family and they are in agreement.  CODE STATUS:full  DVT Prophylaxis: SCD teds  TOTAL TIME TAKING CARE OF THIS PATIENT: 35 minutes.  >50% time spent on counselling and coordination of care  POSSIBLE D/C IN 1-2 DAYS, DEPENDING ON CLINICAL CONDITION.   Catharina Pica M.D on 10/12/2015 at 4:08 PM  Between 7am to 6pm - Pager - [phone]  After 6pm go to [email or web address] - password [email or web address] [facility]  [place] [facility]  Office  [phone]  CC: Primary care physician; Imelda Pillow, NP

## 2015-10-12 NOTE — Progress Notes (Signed)
[facility] [place] [facility]   PATIENT NAME: William Holden    MR#:  [id number]  DATE OF BIRTH:  1935-09-26  SUBJECTIVE:  Via interpreter came in with vomiting . Found to have cholestatic jaundice No complaints today. Family in the room REVIEW OF SYSTEMS:   Review of Systems  Constitutional: Negative for fever, chills and weight loss.  HENT: Negative for ear discharge, ear pain and nosebleeds.   Eyes: Negative for blurred vision, pain and discharge.  Respiratory: Negative for sputum production, shortness of breath, wheezing and stridor.   Cardiovascular: Negative for chest pain, palpitations, orthopnea and PND.  Gastrointestinal: Positive for nausea, vomiting and abdominal pain. Negative for diarrhea.  Genitourinary: Negative for urgency and frequency.  Musculoskeletal: Negative for back pain and joint pain.  Neurological: Negative for sensory change, speech change, focal weakness and weakness.  Psychiatric/Behavioral: Negative for depression and hallucinations. The patient is not nervous/anxious.   All other systems reviewed and are negative.  Tolerating Diet:npo Tolerating PT: not needed  DRUG ALLERGIES:  No Known Allergies  VITALS:  Blood pressure 97/61, pulse 57, temperature 98.3 F (36.8 C), temperature source Oral, resp. rate 17, height 5\' 4"  (1.626 m), weight 125 lb 3.2 oz (56.79 kg), SpO2 96 %.  PHYSICAL EXAMINATION:   Physical Exam  GENERAL:  80 y.o.-year-old patient lying in the bed with no acute distress.  EYES: Pupils equal, round, reactive to light and accommodation. No scleral icterus. Extraocular muscles intact.  HEENT: Head atraumatic, normocephalic. Oropharynx and nasopharynx clear.  NECK:  Supple, no jugular venous distention. No thyroid enlargement, no tenderness.  LUNGS: Normal breath sounds bilaterally, no wheezing, rales, rhonchi. No use of accessory muscles of respiration.  CARDIOVASCULAR: S1, S2 normal. No  murmurs, rubs, or gallops.  ABDOMEN: Soft, nontender, nondistended. Bowel sounds present. No organomegaly or mass.  EXTREMITIES: No cyanosis, clubbing or edema b/l.    NEUROLOGIC: Cranial nerves II through XII are intact. No focal Motor or sensory deficits b/l.   PSYCHIATRIC: The patient is alert and oriented x 3.  SKIN: No obvious rash, lesion, or ulcer.    LABORATORY PANEL:   CBC  Recent Labs Lab 10/11/15 0516  WBC 12.2*  HGB 14.4  HCT 43.3  PLT 199    Chemistries   Recent Labs Lab 10/12/15 0629  NA 140  K 4.1  CL 117*  CO2 19*  GLUCOSE 85  BUN 38*  CREATININE 1.84*  CALCIUM 7.1*  AST 110*  ALT 138*  ALKPHOS 263*  BILITOT 3.1*    Cardiac Enzymes  Recent Labs Lab 10/11/15 0516  TROPONINI <0.03    RADIOLOGY:  [place] Abdomen Complete  10/11/2015  CLINICAL DATA:  Abdominal pain and pancreatitis with elevated lipase level. Abnormal liver function tests. EXAM: ULTRASOUND ABDOMEN COMPLETE COMPARISON:  None. FINDINGS: Gallbladder: The gallbladder is not visualized by ultrasound and is either severely contracted or previously surgically removed. Common bile duct: Diameter: Visualize segment of the common bile duct is at the upper limits of normal in caliber measuring 7 mm. The distal common bile duct is obscured by ultrasound. Liver: The liver parenchyma is very heterogeneous with suggestion of mild intrahepatic biliary ductal dilatation as well as diffuse periportal echogenicity likely reflecting inflammation. Differential considerations include some type of acute hepatitis. There could also be a component of cholangitis. However, the degree of biliary ductal dilatation is only minimal by ultrasound. Some degree of portal hypertension may also be present as there appear to be slow  flow in the main portal vein by real-time imaging. The portal vein is open and demonstrates flow. IVC: No abnormality visualized. Pancreas: Visualized portion unremarkable. Spleen: Size and  appearance within normal limits. Right Kidney: Length: 10.7 cm. Echogenicity within normal limits. No mass or hydronephrosis visualized. Small cysts appear benign. The largest measures 1.4 cm. Left Kidney: Length: 10.1 cm. Echogenicity within normal limits. No mass or hydronephrosis visualized. Medial cystic structure measures 3.3 x 1.8 x 2.5 cm and likely represents a benign cyst. Abdominal aorta: No aneurysm visualized. Other findings: No ascites visualized. IMPRESSION: 1. Nonvisualization of the gallbladder by ultrasound. The gallbladder has either been previously removed or is severely contracted. 2. Very abnormal appearance of the liver parenchyma by ultrasound with pattern suggestive of diffuse periportal edema and inflammation as well as mild biliary ductal dilatation. Findings may be consistent with acute hepatitis versus cholangitis. The degree of biliary ductal dilatation is mild. There may be underlying portal hypertension as well due to visible slow flow in the main portal vein by real-time imaging. No associated splenomegaly or visualized ascites. Electronically Signed   By: Irish Lack M.D.   On: 10/11/2015 10:27   Mr Abdomen Mrcp Wo Cm  10/11/2015  CLINICAL DATA:  Abdominal pain. Pancreatitis with elevated lipase and liver function studies. Abnormal appearance of the liver on ultrasound. EXAM: MRI ABDOMEN WITHOUT CONTRAST  (INCLUDING MRCP) TECHNIQUE: Multiplanar multisequence MR imaging of the abdomen was performed. Heavily T2-weighted images of the biliary and pancreatic ducts were obtained, and three-dimensional MRCP images were rendered by post processing. COMPARISON:  Ultrasound earlier the same date. FINDINGS: Study is mildly motion degraded.  No contrast was administered. Lower chest: Mild atelectasis at both lung bases. No significant pleural effusion. Hepatobiliary: No focal hepatic abnormalities are identified on noncontrast imaging. No normal gallbladder is identified. I believe there  is a mildly dilated cystic duct remnant status post presumed cholecystectomy. There is mild intra and extrahepatic biliary dilatation. The common bile duct measures up to 12 mm in diameter. There is a large calculus in the distal common bile duct. This demonstrates low T2 and high T1 signal and may be calcified. It measures at least 1.7 cm on coronal image 12 of series 16. No other intraductal calculi seen. Pancreas: Unremarkable. No pancreatic ductal dilatation or surrounding inflammation. No evidence of pancreas divisum. Spleen: Normal in size without focal abnormality. Adrenals/Urinary Tract: Both adrenal glands appear normal. There are small renal cysts bilaterally. There is nonspecific perinephric soft tissue stranding bilaterally. No hydronephrosis. Stomach/Bowel: No evidence of bowel wall thickening, distention or surrounding inflammatory change. Vascular/Lymphatic: There are no enlarged abdominal lymph nodes. No significant vascular findings are seen. Other: None. Musculoskeletal: No acute or significant osseous findings. Mild lumbar spondylosis. IMPRESSION: 1. Large obstructing calculus in the distal common bile duct with associated biliary dilatation. ERCP recommended. 2. The gallbladder is not clearly seen status post presumed cholecystectomy. The cystic duct remnant appears mildly dilated. Evaluation limited by breathing artifact. 3. No evidence of pancreatitis or pancreatic ductal dilatation. Electronically Signed   By: Carey Bullocks M.D.   On: 10/11/2015 18:51   Dg Abd Acute W/chest  10/11/2015  CLINICAL DATA:  Abdominal pain and vomiting, duration 1 hour. EXAM: DG ABDOMEN ACUTE W/ 1V CHEST COMPARISON:  None. FINDINGS: The abdominal gas pattern negative for obstruction perforation. No biliary or urinary calculi are evident. The upright view the chest demonstrates mild atelectatic appearing basilar lung opacities bilaterally. IMPRESSION: Negative for obstruction or perforation. Atelectatic  appearing lung  base opacities bilaterally. Electronically Signed   By: Ellery Plunk M.D.   On: 10/11/2015 06:46     ASSESSMENT AND PLAN:  80 year old Hispanic male admitted for acute pancreatitis and acute hepatitis. 1. Acute pancreatitis: suspected due to cholestatic jaundice -NPO -IVF -Seen by GI - MRCP showed dilated CBD -for ERCP  today  2. Sepsis: The patient intermittently meets criteria via leukocytosis, tachycardia and tachypnea. -BC neg Sepsis resolved  3. Acute kidney injury: Possibly multifactorial including pancreatitis as well as dehydration.  Avoid nephrotoxic drugs.  Hydrate with intravenous fluid.  4. Acute hepatitis: Rule out infectious causes as well as autoimmune hepatitis Hepatitis panel.  5. DVT prophylaxis: Heparin  6. GI prophylaxis: Pantoprazole  Case discussed with Care Management/Social Worker. Management plans discussed with the patient, family and they are in agreement.  CODE STATUS:full  DVT Prophylaxis: SCD teds  TOTAL TIME TAKING CARE OF THIS PATIENT: 35 minutes.  >50% time spent on counselling and coordination of care  POSSIBLE D/C IN 1-2 DAYS, DEPENDING ON CLINICAL CONDITION.   Keirra Zeimet M.D on 10/12/2015 at 4:15 PM  Between 7am to 6pm - Pager - [phone]  After 6pm go to [email or web address] - password [email or web address] [facility]  [place] [facility]  Office  [phone]  CC: Primary care physician; Imelda Pillow, NP

## 2015-10-12 NOTE — Anesthesia Preprocedure Evaluation (Addendum)
Anesthesia Evaluation  Patient identified by MRN, date of birth, ID band Patient awake    Reviewed: Allergy & Precautions, NPO status , Patient's Chart, lab work & pertinent test results  Airway Mallampati: II  TM Distance: >3 FB     Dental  (+) Chipped   Pulmonary neg pulmonary ROS,    Pulmonary exam normal breath sounds clear to auscultation       Cardiovascular hypertension, negative cardio ROS Normal cardiovascular exam     Neuro/Psych negative neurological ROS  negative psych ROS   GI/Hepatic PUD, Questionable HX of hepatitis... Does have hx of pancreatitis... Possible cirrhosis...elevated liver enzymes Common bile duct stone...epigastric pain   Endo/Other  negative endocrine ROS  Renal/GU Renal Insufficiency and ARFRenal disease     Musculoskeletal negative musculoskeletal ROS (+)   Abdominal Normal abdominal exam  (+)   Peds negative pediatric ROS (+)  Hematology negative hematology ROS (+)   Anesthesia Other Findings   Reproductive/Obstetrics                            Anesthesia Physical Anesthesia Plan  ASA: III and emergent  Anesthesia Plan: General   Post-op Pain Management:    Induction: Intravenous, Rapid sequence and Cricoid pressure planned  Airway Management Planned: Oral ETT  Additional Equipment:   Intra-op Plan:   Post-operative Plan: Extubation in OR  Informed Consent: I have reviewed the patients History and Physical, chart, labs and discussed the procedure including the risks, benefits and alternatives for the proposed anesthesia with the patient or authorized representative who has indicated his/her understanding and acceptance.   Dental advisory given  Plan Discussed with: CRNA and Surgeon  Anesthesia Plan Comments:         Anesthesia Quick Evaluation

## 2015-10-12 NOTE — Op Note (Signed)
[facility] Patient Name: William Holden Procedure Date: 10/12/2015 12:39 PM MRN: [id number] Account #: [id number] Date of Birth: 06/06/1935 Admit Type: Inpatient Age: 80 Room: [facility] ENDO ROOM 4 Gender: Male Note Status: Finalized Procedure:         ERCP Indications:       Bile duct stone(s) Providers:         Midge Minium, MD Medicines:         General Anesthesia Complications:     No immediate complications. Procedure:         Pre-Anesthesia Assessment:                    - Prior to the procedure, a History and Physical was                     performed, and patient medications and allergies were                     reviewed. The patient's tolerance of previous anesthesia                     was also reviewed. The risks and benefits of the procedure                     and the sedation options and risks were discussed with the                     patient. All questions were answered, and informed consent                     was obtained. Prior Anticoagulants: The patient has taken                     no previous anticoagulant or antiplatelet agents. ASA                     Grade Assessment: II - A patient with mild systemic                     disease. After reviewing the risks and benefits, the                     patient was deemed in satisfactory condition to undergo                     the procedure.                    After obtaining informed consent, the scope was passed                     under direct vision. Throughout the procedure, the                     patient's blood pressure, pulse, and oxygen saturations                     were monitored continuously. The ERCP was introduced                     through the mouth, and used to inject contrast into and                     used to inject contrast into the bile duct  and ventral                     pancreatic duct. The ERCP was accomplished without   difficulty. The patient tolerated the procedure well. Findings:      The major papilla was bulging. A wire was passed into the ventral       pancreatic duct. A wire was passed into the biliary tree. The bile duct       alone was deeply cannulated. Contrast was injected. I personally       interpreted the bile duct images. There was brisk flow of contrast       through the ducts. Image quality was excellent. The main bile duct was       diffusely dilated, acquired. The lower third of the main bile duct       contained one stone, which was 17 mm in diameter. A 12 mm biliary       sphincterotomy was made with a sphincterotome using ERBE electrocautery.       There was no post-sphincterotomy bleeding. The biliary tree was swept       with an 15 mm balloon starting at the bifurcation. Sludge was swept from       the duct. One stone was removed. No stones remained. There was duodenal       ulcerations seen. Impression:        - The major papilla appeared to be bulging.                    - The entire main bile duct was dilated, acquired.                    - A sphincterotomy was performed.                    - The biliary tree was swept.                    - Choledocholithiasis was found. Complete removal was                     accomplished by biliary sphincterotomy. Recommendation:    - Watch for pancreatitis, bleeding, perforation, and                     cholangitis. Procedure Code(s): --- Professional ---                    [phone], Endoscopic retrograde cholangiopancreatography                     (ERCP); with removal of calculi/debris from                     biliary/pancreatic duct(s)                    43262, Endoscopic retrograde cholangiopancreatography                     (ERCP); with sphincterotomy/papillotomy                    [phone], Endoscopic catheterization of the biliary ductal                     system, radiological supervision and interpretation Diagnosis Code(s): ---  Professional ---  K80.50, Calculus of bile duct without cholangitis or                     cholecystitis without obstruction                    K83.9, Disease of biliary tract, unspecified                    K83.8, Other specified diseases of biliary tract CPT copyright 2014 American Medical Association. All rights reserved. The codes documented in this report are preliminary and upon coder review may  be revised to meet current compliance requirements. Midge Minium, MD 10/12/2015 1:44:52 PM This report has been signed electronically. Number of Addenda: 0 Note Initiated On: 10/12/2015 12:39 PM      Revision [facility]

## 2015-10-13 ENCOUNTER — Encounter: Payer: Self-pay | Admitting: Gastroenterology

## 2015-10-13 DIAGNOSIS — K8050 Calculus of bile duct without cholangitis or cholecystitis without obstruction: Secondary | ICD-10-CM

## 2015-10-13 DIAGNOSIS — K269 Duodenal ulcer, unspecified as acute or chronic, without hemorrhage or perforation: Secondary | ICD-10-CM

## 2015-10-13 LAB — CULTURE, BLOOD (ROUTINE X 2)

## 2015-10-13 LAB — COMPREHENSIVE METABOLIC PANEL
ALBUMIN: 2.1 g/dL — AB (ref 3.5–5.0)
ALK PHOS: 275 U/L — AB (ref 38–126)
ALT: 97 U/L — AB (ref 17–63)
AST: 61 U/L — AB (ref 15–41)
Anion gap: 5 (ref 5–15)
BUN: 30 mg/dL — AB (ref 6–20)
CALCIUM: 7.3 mg/dL — AB (ref 8.9–10.3)
CO2: 21 mmol/L — AB (ref 22–32)
CREATININE: 1.67 mg/dL — AB (ref 0.61–1.24)
Chloride: 115 mmol/L — ABNORMAL HIGH (ref 101–111)
GFR calc Af Amer: 44 mL/min — ABNORMAL LOW (ref >60)
GFR calc non Af Amer: 38 mL/min — ABNORMAL LOW (ref >60)
GLUCOSE: 79 mg/dL (ref 65–99)
Potassium: 3.9 mmol/L (ref 3.5–5.1)
SODIUM: 141 mmol/L (ref 135–145)
Total Bilirubin: 2.8 mg/dL — ABNORMAL HIGH (ref 0.3–1.2)
Total Protein: 5.4 g/dL — ABNORMAL LOW (ref 6.5–8.1)

## 2015-10-13 LAB — ENA+DNA/DS+SJORGEN'S
ENA SM Ab Ser-aCnc: <0.2 AI (ref 0.0–0.9)
Ribonucleic Protein: <0.2 AI (ref 0.0–0.9)
SSA (Ro) (ENA) Antibody, IgG: <0.2 AI (ref 0.0–0.9)
SSB (La) (ENA) Antibody, IgG: <0.2 AI (ref 0.0–0.9)
ds DNA Ab: 3 IU/mL (ref 0–9)

## 2015-10-13 LAB — HEPATITIS PANEL, ACUTE
HCV Ab: <0.1 s/co ratio (ref 0.0–0.9)
Hep A IgM: NEGATIVE
Hep B C IgM: NEGATIVE
Hepatitis B Surface Ag: NEGATIVE

## 2015-10-13 LAB — ANA W/REFLEX: Anti Nuclear Antibody(ANA): POSITIVE — AB

## 2015-10-13 MED ORDER — ENOXAPARIN SODIUM 40 MG/0.4ML ~~LOC~~ SOLN: 40.0000 mg | SUBCUTANEOUS | Status: DC

## 2015-10-13 MED ORDER — SODIUM CHLORIDE 0.9 % IV SOLN
1.0000 g | INTRAVENOUS | Status: DC
  Administered 2015-10-13 – 2015-10-14 (×2): 1 g via INTRAVENOUS
  Filled 2015-10-13 (×5): qty 1

## 2015-10-13 MED ORDER — OMEPRAZOLE MAGNESIUM 20 MG PO TBEC: 20.0000 mg | DELAYED_RELEASE_TABLET | Freq: Every day | ORAL | Status: DC

## 2015-10-13 NOTE — Anesthesia Postprocedure Evaluation (Signed)
Anesthesia Post Note  Patient: William Holden  Procedure(s) Performed: Procedure(s) (LRB): ENDOSCOPIC RETROGRADE CHOLANGIOPANCREATOGRAPHY (ERCP) WITH PROPOFOL (N/A)  Patient location during evaluation: PACU Anesthesia Type: General Level of consciousness: awake, awake and alert and oriented Pain management: pain level controlled Vital Signs Assessment: post-procedure vital signs reviewed and stable Respiratory status: spontaneous breathing Cardiovascular status: blood pressure returned to baseline Anesthetic complications: no    Last Vitals:  Filed Vitals:   10/12/15 2101 10/13/15 0532  BP: 90/55 109/53  Pulse: 52 53  Temp: 36.8 C   Resp: 19 17    Last Pain:  Filed Vitals:   10/13/15 0808  PainSc: 0-No pain                 Debe Anfinson

## 2015-10-13 NOTE — Progress Notes (Signed)
Pt alert and oriented. Visitors at bedside. Notified MD about pt concerns regarding treatment. Interpreter used to speak with patient and family. MD updated patient and family on care. Pt resting in bed continue to assess.

## 2015-10-13 NOTE — Progress Notes (Signed)
The patient has improved liver tests. Now with bacteremia being followed by the Dr. Sampson GoonFitzgerald. Nothing further to do from a GI point of view. Will sign off. Please call with any questions.

## 2015-10-13 NOTE — Progress Notes (Signed)
Patient's blood cultures positive. Blood cultures growing ESBL E coli. His discharge is being suspended. Changed antibiotics to Invanz. ID consult requested for outpatient antibiotics. Order for PICC line placed.

## 2015-10-13 NOTE — Discharge Summary (Signed)
[facility] - [facility]   PATIENT NAME: William Holden    MR#:  [id number]  DATE OF BIRTH:  12-06-1934  DATE OF ADMISSION:  10/11/2015 ADMITTING PHYSICIAN: Arnaldo Natal, MD  DATE OF DISCHARGE: 10/13/2015  PRIMARY CARE PHYSICIAN: Imelda Pillow, NP    ADMISSION DIAGNOSIS:  Pancreatitis [K85.9] Epigastric pain [R10.13] Renal insufficiency [N28.9] Elevated liver enzymes [R74.8] Acute pancreatitis, unspecified pancreatitis type [K85.9] Non-intractable vomiting with nausea, vomiting of unspecified type [R11.2]  DISCHARGE DIAGNOSIS:  Principal Problem:   Choledocholithiasis Active Problems:   Pancreatitis   Abnormal [place] (ultrasound) of abdomen   Pancreatitis, acute   Elevated liver enzymes   Epigastric pain   Biliary colic   Disease of biliary tract   Duodenal ulcer   SECONDARY DIAGNOSIS:   Past Medical History  Diagnosis Date  . Bowel obstruction [facility])     HOSPITAL COURSE:   80 year old Hispanic male admitted for acute pancreatitis and acute hepatitis.  1. Acute pancreatitis: suspected due to cholestatic jaundice -From choledocholithiasis. Patient is status post cholecystectomy. -MRCP revealed dilated common bile duct and a stone in the duct. Appreciate GI consult. -ERCP done and stone removed. No other stones seen. -Tolerating diet well. No complaints of abdominal pain or nausea. Pancreatitis resolved.  2. Sepsis: Sepsis resolved -Secondary to pancreatitis.  3. Acute kidney injury: Possibly multifactorial including pancreatitis as well as dehydration.  Avoid nephrotoxic drugs. -Slow improvement with fluids noted. Recheck as outpatient.  4. Acute hepatitis: From obstructive jaundice, from choledocholithiasis. -Stone extraction done. Labs improving. Follow-up labs in 2 days  5. Duodenal ulcerations-noted on ERCP, incidentally. -We'll start on Prilosec. If symptoms recur, H pylori testing can be  done.  Patient is completely asymptomatic, tolerating diet well. Very eager to go home. He will be discharged today.  DISCHARGE CONDITIONS:  Stable  CONSULTS OBTAINED:  Treatment Team:  Enedina Finner, MD Midge Minium, MD  DRUG ALLERGIES:  No Known Allergies  DISCHARGE MEDICATIONS:   Current Discharge Medication List    START taking these medications   Details  omeprazole (PRILOSEC OTC) 20 MG tablet Take 1 tablet (20 mg total) by mouth daily. Qty: 30 tablet, Refills: 2         DISCHARGE INSTRUCTIONS:   1. PCP follow-up in 2 days for recheck of comprehensive metabolic panel 2. Follow-up with GI in 2 weeks  If you experience worsening of your admission symptoms, develop shortness of breath, life threatening emergency, suicidal or homicidal thoughts you must seek medical attention immediately by calling 911 or calling your MD immediately  if symptoms less severe.  You Must read complete instructions/literature along with all the possible adverse reactions/side effects for all the Medicines you take and that have been prescribed to you. Take any new Medicines after you have completely understood and accept all the possible adverse reactions/side effects.   Please note  You were cared for by a hospitalist during your hospital stay. If you have any questions about your discharge medications or the care you received while you were in the hospital after you are discharged, you can call the unit and asked to speak with the hospitalist on call if the hospitalist that took care of you is not available. Once you are discharged, your primary care physician will handle any further medical issues. Please note that NO REFILLS for any discharge medications will be authorized once you are discharged, as it is imperative that you return to your primary care physician (or establish a  relationship with a primary care physician if you do not have one) for your aftercare needs so that they can reassess  your need for medications and monitor your lab values.    Today   CHIEF COMPLAINT:   Chief Complaint  Patient presents with  . Abdominal Pain  . Emesis    VITAL SIGNS:  Blood pressure 109/53, pulse 53, temperature 98.2 F (36.8 C), temperature source Oral, resp. rate 17, height  (1.626 m), weight 58.106 kg (128 lb 1.6 oz), SpO2 96 %.  I/O:   Intake/Output Summary (Last 24 hours) at 10/13/15 0803 Last data filed at 10/13/15 0451  Gross per 24 hour  Intake   2296 ml  Output      0 ml  Net   2296 ml    PHYSICAL EXAMINATION:   Physical Exam  GENERAL:  80 y.o.-year-old patient lying in the bed with no acute distress.  EYES: Pupils equal, round, reactive to light and accommodation. No scleral icterus. Extraocular muscles intact.  HEENT: Head atraumatic, normocephalic. Oropharynx and nasopharynx clear.  NECK:  Supple, no jugular venous distention. No thyroid enlargement, no tenderness.  LUNGS: Normal breath sounds bilaterally, no wheezing, rales,rhonchi or crepitation. No use of accessory muscles of respiration.  CARDIOVASCULAR: S1, S2 normal. No murmurs, rubs, or gallops.  ABDOMEN: Soft, non-tender, non-distended. Bowel sounds present. No organomegaly or mass.  EXTREMITIES: No pedal edema, cyanosis, or clubbing.  NEUROLOGIC: Cranial nerves II through XII are intact. Muscle strength 5/5 in all extremities. Sensation intact. Gait not checked.  PSYCHIATRIC: The patient is alert and oriented x 3.  SKIN: No obvious rash, lesion, or ulcer.   DATA REVIEW:   CBC  Recent Labs Lab 10/11/15 0516  WBC 12.2*  HGB 14.4  HCT 43.3  PLT 199    Chemistries   Recent Labs Lab 10/13/15 0616  NA 141  K 3.9  CL 115*  CO2 21*  GLUCOSE 79  BUN 30*  CREATININE 1.67*  CALCIUM 7.3*  AST 61*  ALT 97*  ALKPHOS 275*  BILITOT 2.8*    Cardiac Enzymes  Recent Labs Lab 10/11/15 0516  TROPONINI <0.03    Microbiology Results  Results for orders placed or performed  during the hospital encounter of 10/11/15  Culture, blood (routine x 2)     Status: None (Preliminary result)   Collection Time: 10/11/15  8:10 AM  Result Value Ref Range Status   Specimen Description BLOOD LEFT AC  Final   Special Requests BOTTLES DRAWN AEROBIC AND ANAEROBIC AER4CC,ANA1CC  Final   Culture  Setup Time   Final    GRAM NEGATIVE RODS IN BOTH AEROBIC AND ANAEROBIC BOTTLES CRITICAL RESULT CALLED TO, READ BACK BY AND VERIFIED WITH: ASHLEY ANDREW AT 2020 ON 10/11/15 [facility] CONFIRMED BY [facility]    Culture   Final    GRAM NEGATIVE RODS IN BOTH AEROBIC AND ANAEROBIC BOTTLES IDENTIFICATION AND SUSCEPTIBILITIES TO FOLLOW    Report Status PENDING  Incomplete  Culture, blood (routine x 2)     Status: None (Preliminary result)   Collection Time: 10/11/15  8:17 AM  Result Value Ref Range Status   Specimen Description BLOOD RIGHT AC  Final   Special Requests BOTTLES DRAWN AEROBIC AND ANAEROBIC AER5CC,ANA1CC  Final   Culture  Setup Time   Final    GRAM NEGATIVE RODS IN BOTH AEROBIC AND ANAEROBIC BOTTLES CRITICAL RESULT CALLED TO, READ BACK BY AND VERIFIED WITH: ASHLEY ANDREW AT 2020 ON 10/11/15 RWW CONFIRMED BY [facility]  Culture   Final    GRAM NEGATIVE RODS IN BOTH AEROBIC AND ANAEROBIC BOTTLES IDENTIFICATION AND SUSCEPTIBILITIES TO FOLLOW    Report Status PENDING  Incomplete    RADIOLOGY:  [place] Abdomen Complete  10/11/2015  CLINICAL DATA:  Abdominal pain and pancreatitis with elevated lipase level. Abnormal liver function tests. EXAM: ULTRASOUND ABDOMEN COMPLETE COMPARISON:  None. FINDINGS: Gallbladder: The gallbladder is not visualized by ultrasound and is either severely contracted or previously surgically removed. Common bile duct: Diameter: Visualize segment of the common bile duct is at the upper limits of normal in caliber measuring 7 mm. The distal common bile duct is obscured by ultrasound. Liver: The liver parenchyma is very heterogeneous with suggestion of mild intrahepatic  biliary ductal dilatation as well as diffuse periportal echogenicity likely reflecting inflammation. Differential considerations include some type of acute hepatitis. There could also be a component of cholangitis. However, the degree of biliary ductal dilatation is only minimal by ultrasound. Some degree of portal hypertension may also be present as there appear to be slow flow in the main portal vein by real-time imaging. The portal vein is open and demonstrates flow. IVC: No abnormality visualized. Pancreas: Visualized portion unremarkable. Spleen: Size and appearance within normal limits. Right Kidney: Length: 10.7 cm. Echogenicity within normal limits. No mass or hydronephrosis visualized. Small cysts appear benign. The largest measures 1.4 cm. Left Kidney: Length: 10.1 cm. Echogenicity within normal limits. No mass or hydronephrosis visualized. Medial cystic structure measures 3.3 x 1.8 x 2.5 cm and likely represents a benign cyst. Abdominal aorta: No aneurysm visualized. Other findings: No ascites visualized. IMPRESSION: 1. Nonvisualization of the gallbladder by ultrasound. The gallbladder has either been previously removed or is severely contracted. 2. Very abnormal appearance of the liver parenchyma by ultrasound with pattern suggestive of diffuse periportal edema and inflammation as well as mild biliary ductal dilatation. Findings may be consistent with acute hepatitis versus cholangitis. The degree of biliary ductal dilatation is mild. There may be underlying portal hypertension as well due to visible slow flow in the main portal vein by real-time imaging. No associated splenomegaly or visualized ascites. Electronically Signed   By: Irish LackGlenn  Yamagata M.D.   On: 10/11/2015 10:27   Mr Abdomen Mrcp Wo Cm  10/11/2015  CLINICAL DATA:  Abdominal pain. Pancreatitis with elevated lipase and liver function studies. Abnormal appearance of the liver on ultrasound. EXAM: MRI ABDOMEN WITHOUT CONTRAST  (INCLUDING  MRCP) TECHNIQUE: Multiplanar multisequence MR imaging of the abdomen was performed. Heavily T2-weighted images of the biliary and pancreatic ducts were obtained, and three-dimensional MRCP images were rendered by post processing. COMPARISON:  Ultrasound earlier the same date. FINDINGS: Study is mildly motion degraded.  No contrast was administered. Lower chest: Mild atelectasis at both lung bases. No significant pleural effusion. Hepatobiliary: No focal hepatic abnormalities are identified on noncontrast imaging. No normal gallbladder is identified. I believe there is a mildly dilated cystic duct remnant status post presumed cholecystectomy. There is mild intra and extrahepatic biliary dilatation. The common bile duct measures up to 12 mm in diameter. There is a large calculus in the distal common bile duct. This demonstrates low T2 and high T1 signal and may be calcified. It measures at least 1.7 cm on coronal image 12 of series 16. No other intraductal calculi seen. Pancreas: Unremarkable. No pancreatic ductal dilatation or surrounding inflammation. No evidence of pancreas divisum. Spleen: Normal in size without focal abnormality. Adrenals/Urinary Tract: Both adrenal glands appear normal. There are small renal cysts bilaterally.  There is nonspecific perinephric soft tissue stranding bilaterally. No hydronephrosis. Stomach/Bowel: No evidence of bowel wall thickening, distention or surrounding inflammatory change. Vascular/Lymphatic: There are no enlarged abdominal lymph nodes. No significant vascular findings are seen. Other: None. Musculoskeletal: No acute or significant osseous findings. Mild lumbar spondylosis. IMPRESSION: 1. Large obstructing calculus in the distal common bile duct with associated biliary dilatation. ERCP recommended. 2. The gallbladder is not clearly seen status post presumed cholecystectomy. The cystic duct remnant appears mildly dilated. Evaluation limited by breathing artifact. 3. No  evidence of pancreatitis or pancreatic ductal dilatation. Electronically Signed   By: Carey Bullocks M.D.   On: 10/11/2015 18:51    EKG:   Orders placed or performed during the hospital encounter of 10/11/15  . ED EKG  . ED EKG      Management plans discussed with the patient, family and they are in agreement.  CODE STATUS:     Code Status Orders        Start     Ordered   10/11/15 1039  Full code   Continuous     10/11/15 1038      TOTAL TIME TAKING CARE OF THIS PATIENT:38 minutes.    Enid Baas M.D on 10/13/2015 at 8:03 AM  Between 7am to 6pm - Pager - [phone]  After 6pm go to [email or web address] - password [email or web address] [facility]  [facility]  Office  [phone]  CC: Primary care physician; Imelda Pillow, NP

## 2015-10-13 NOTE — Care Management (Addendum)
Patient referred to [facility] for Home IvABX Invanz. Awaiting PICC placement.

## 2015-10-13 NOTE — Progress Notes (Signed)
Notified MD of pt blood culture lab results. Awaiting new orders

## 2015-10-13 NOTE — Care Management Important Message (Signed)
Important Message  Patient Details  Name: Lesle ReekCrisanto Dimas Valdez MRN: [id number] Date of Birth: 08-Aug-1935   Medicare Important Message Given:  Yes    Olegario MessierKathy A Krissi Willaims 10/13/2015, 9:44 AM

## 2015-10-13 NOTE — Consult Note (Signed)
[facility] Infectious Disease     Reason for Consult:ESBL E coli bacteremia, cholangitis   Referring Physician: Claria Dice Date of Admission:  10/11/2015   Principal Problem:   Choledocholithiasis Active Problems:   Pancreatitis   Abnormal [place] (ultrasound) of abdomen   Pancreatitis, acute   Elevated liver enzymes   Epigastric pain   Biliary colic   Disease of biliary tract   Duodenal ulcer   HPI: William Holden is a 80 y.o. male admitted with abd pain and found to have elevated LFTs and lipase and a large biliary stone which was extracted. He was also found to have ESBL E coli bacteremia. Clinically much improved after stone removal   Past Medical History  Diagnosis Date  . Bowel obstruction [facility])    Past Surgical History  Procedure Laterality Date  . Cholecystectomy    . Endoscopic retrograde cholangiopancreatography (ercp) with propofol N/A 10/12/2015    Procedure: ENDOSCOPIC RETROGRADE CHOLANGIOPANCREATOGRAPHY (ERCP) WITH PROPOFOL;  Surgeon: Lucilla Lame, MD;  Location: [facility] ENDOSCOPY;  Service: Endoscopy;  Laterality: N/A;   Social History  Substance Use Topics  . Smoking status: Never Smoker   . Smokeless tobacco: None  . Alcohol Use: No   Family History  Problem Relation Age of Onset  . Hypertension Other     Allergies: No Known Allergies  Current antibiotics: Antibiotics Given (last 72 hours)    Date/Time Action Medication Dose Rate   10/11/15 1058 Given   cefTRIAXone (ROCEPHIN) 1 g in dextrose 5 % 50 mL IVPB 1 g 100 mL/hr   10/12/15 0948 Given   cefTRIAXone (ROCEPHIN) 2 g in dextrose 5 % 50 mL IVPB 2 g 100 mL/hr      MEDICATIONS: . docusate sodium  100 mg Oral BID  . ertapenem  1 g Intravenous Q24H  . sodium chloride  3 mL Intravenous Q12H    Review of Systems - 11 systems reviewed and negative per HPI   OBJECTIVE: Temp:  [98.1 F (36.7 C)-98.2 F (36.8 C)] 98.1 F (36.7 C) (12/05 1231) Pulse Rate:  [52-60] 56 (12/05 1231) Resp:   [17-19] 18 (12/05 1231) BP: (90-119)/(53-63) 119/63 mmHg (12/05 1231) SpO2:  [96 %-97 %] 96 % (12/05 1231) Weight:  [58.106 kg (128 lb 1.6 oz)] 58.106 kg (128 lb 1.6 oz) (12/05 0500) Physical Exam  Constitutional: He is oriented to person, place, and time. He appears well-developed and well-nourished. No distress.  HENT: anicteric Mouth/Throat: Oropharynx is clear and moist. No oropharyngeal exudate.  Cardiovascular: Normal rate, regular rhythm and normal heart sounds. Exam reveals no gallop and no friction rub.  No murmur heard.  Pulmonary/Chest: Effort normal and breath sounds normal. No respiratory distress. He has no wheezes.  Abdominal: Soft. Bowel sounds are normal. He exhibits no distension. There is no tenderness.  Lymphadenopathy:  He has no cervical adenopathy.  Neurological: He is alert and oriented to person, place, and time.  Skin: Skin is warm and dry. No rash noted. No erythema.  Psychiatric: He has a normal mood and affect. His behavior is normal.     LABS: Results for orders placed or performed during the hospital encounter of 10/11/15 (from the past 48 hour(s))  Comprehensive metabolic panel     Status: Abnormal   Collection Time: 10/12/15  6:29 AM  Result Value Ref Range   Sodium 140 135 - 145 mmol/L   Potassium 4.1 3.5 - 5.1 mmol/L   Chloride 117 (H) 101 - 111 mmol/L   CO2 19 (L)  22 - 32 mmol/L   Glucose, Bld 85 65 - 99 mg/dL   BUN 38 (H) 6 - 20 mg/dL   Creatinine, Ser 1.84 (H) 0.61 - 1.24 mg/dL   Calcium 7.1 (L) 8.9 - 10.3 mg/dL   Total Protein 5.5 (L) 6.5 - 8.1 g/dL   Albumin 2.2 (L) 3.5 - 5.0 g/dL   AST 110 (H) 15 - 41 U/L   ALT 138 (H) 17 - 63 U/L   Alkaline Phosphatase 263 (H) 38 - 126 U/L   Total Bilirubin 3.1 (H) 0.3 - 1.2 mg/dL   GFR calc non Af Amer 34 (L) >60 mL/min   GFR calc Af Amer 39 (L) >60 mL/min    Comment: (NOTE) The eGFR has been calculated using the CKD EPI equation. This calculation has not been validated in all clinical  situations. eGFR's persistently <60 mL/min signify possible Chronic Kidney Disease.    Anion gap 4 (L) 5 - 15  Lipase, blood     Status: None   Collection Time: 10/12/15  6:29 AM  Result Value Ref Range   Lipase 35 11 - 51 U/L  Comprehensive metabolic panel     Status: Abnormal   Collection Time: 10/13/15  6:16 AM  Result Value Ref Range   Sodium 141 135 - 145 mmol/L   Potassium 3.9 3.5 - 5.1 mmol/L   Chloride 115 (H) 101 - 111 mmol/L   CO2 21 (L) 22 - 32 mmol/L   Glucose, Bld 79 65 - 99 mg/dL   BUN 30 (H) 6 - 20 mg/dL   Creatinine, Ser 1.67 (H) 0.61 - 1.24 mg/dL   Calcium 7.3 (L) 8.9 - 10.3 mg/dL   Total Protein 5.4 (L) 6.5 - 8.1 g/dL   Albumin 2.1 (L) 3.5 - 5.0 g/dL   AST 61 (H) 15 - 41 U/L   ALT 97 (H) 17 - 63 U/L   Alkaline Phosphatase 275 (H) 38 - 126 U/L   Total Bilirubin 2.8 (H) 0.3 - 1.2 mg/dL   GFR calc non Af Amer 38 (L) >60 mL/min   GFR calc Af Amer 44 (L) >60 mL/min    Comment: (NOTE) The eGFR has been calculated using the CKD EPI equation. This calculation has not been validated in all clinical situations. eGFR's persistently <60 mL/min signify possible Chronic Kidney Disease.    Anion gap 5 5 - 15   No components found for: ESR, C REACTIVE PROTEIN MICRO: Recent Results (from the past 720 hour(s))  Culture, blood (routine x 2)     Status: None   Collection Time: 10/11/15  8:10 AM  Result Value Ref Range Status   Specimen Description BLOOD LEFT AC  Final   Special Requests BOTTLES DRAWN AEROBIC AND ANAEROBIC AER4CC,ANA1CC  Final   Culture  Setup Time   Final    GRAM NEGATIVE RODS IN BOTH AEROBIC AND ANAEROBIC BOTTLES CRITICAL RESULT CALLED TO, READ BACK BY AND VERIFIED WITH: ASHLEY ANDREW AT 2020 ON 10/11/15 RWW CONFIRMED BY [facility]    Culture   Final    ESCHERICHIA COLI Susceptibility Pattern Suggests Possibility of an Extended Spectrum Beta Lactamase Producer. Contact Laboratory Within 7 Days if Confirmation Warranted. IN BOTH AEROBIC AND ANAEROBIC  BOTTLES CRITICAL RESULT CALLED TO, READ BACK BY AND VERIFIED WITH: RN ZANETA HARRIS 10/13/15 0912 MLM    Report Status 10/13/2015 FINAL  Final   Organism ID, Bacteria ESCHERICHIA COLI  Final      Susceptibility   Escherichia coli - MIC*  AMPICILLIN >=32 RESISTANT Resistant     CEFTAZIDIME 4 RESISTANT Resistant     CEFAZOLIN >=64 RESISTANT Resistant     CEFTRIAXONE 16 RESISTANT Resistant     CIPROFLOXACIN >=4 RESISTANT Resistant     GENTAMICIN >=16 RESISTANT Resistant     IMIPENEM <=0.25 SENSITIVE Sensitive     TRIMETH/SULFA >=320 RESISTANT Resistant     Extended ESBL POSITIVE Resistant     PIP/TAZO Value in next row Sensitive      SENSITIVE<=4    * ESCHERICHIA COLI  Culture, blood (routine x 2)     Status: None (Preliminary result)   Collection Time: 10/11/15  8:17 AM  Result Value Ref Range Status   Specimen Description BLOOD RIGHT AC  Final   Special Requests BOTTLES DRAWN AEROBIC AND ANAEROBIC AER5CC,ANA1CC  Final   Culture  Setup Time   Final    GRAM NEGATIVE RODS IN BOTH AEROBIC AND ANAEROBIC BOTTLES CRITICAL RESULT CALLED TO, READ BACK BY AND VERIFIED WITH: ASHLEY ANDREW AT 2020 ON 10/11/15 RWW CONFIRMED BY [facility]    Culture   Final    GRAM NEGATIVE RODS IN BOTH AEROBIC AND ANAEROBIC BOTTLES IDENTIFICATION AND SUSCEPTIBILITIES TO FOLLOW    Report Status PENDING  Incomplete    IMAGING: US Abdomen Complete  10/11/2015  CLINICAL DATA:  Abdominal pain and pancreatitis with elevated lipase level. Abnormal liver function tests. EXAM: ULTRASOUND ABDOMEN COMPLETE COMPARISON:  None. FINDINGS: Gallbladder: The gallbladder is not visualized by ultrasound and is either severely contracted or previously surgically removed. Common bile duct: Diameter: Visualize segment of the common bile duct is at the upper limits of normal in caliber measuring 7 mm. The distal common bile duct is obscured by ultrasound. Liver: The liver parenchyma is very heterogeneous with suggestion of mild  intrahepatic biliary ductal dilatation as well as diffuse periportal echogenicity likely reflecting inflammation. Differential considerations include some type of acute hepatitis. There could also be a component of cholangitis. However, the degree of biliary ductal dilatation is only minimal by ultrasound. Some degree of portal hypertension may also be present as there appear to be slow flow in the main portal vein by real-time imaging. The portal vein is open and demonstrates flow. IVC: No abnormality visualized. Pancreas: Visualized portion unremarkable. Spleen: Size and appearance within normal limits. Right Kidney: Length: 10.7 cm. Echogenicity within normal limits. No mass or hydronephrosis visualized. Small cysts appear benign. The largest measures 1.4 cm. Left Kidney: Length: 10.1 cm. Echogenicity within normal limits. No mass or hydronephrosis visualized. Medial cystic structure measures 3.3 x 1.8 x 2.5 cm and likely represents a benign cyst. Abdominal aorta: No aneurysm visualized. Other findings: No ascites visualized. IMPRESSION: 1. Nonvisualization of the gallbladder by ultrasound. The gallbladder has either been previously removed or is severely contracted. 2. Very abnormal appearance of the liver parenchyma by ultrasound with pattern suggestive of diffuse periportal edema and inflammation as well as mild biliary ductal dilatation. Findings may be consistent with acute hepatitis versus cholangitis. The degree of biliary ductal dilatation is mild. There may be underlying portal hypertension as well due to visible slow flow in the main portal vein by real-time imaging. No associated splenomegaly or visualized ascites. Electronically Signed   By: Aletta Edouard M.D.   On: 10/11/2015 10:27   Mr Abdomen Mrcp Wo Cm  10/11/2015  CLINICAL DATA:  Abdominal pain. Pancreatitis with elevated lipase and liver function studies. Abnormal appearance of the liver on ultrasound. EXAM: MRI ABDOMEN WITHOUT CONTRAST   (INCLUDING MRCP) TECHNIQUE:  Multiplanar multisequence MR imaging of the abdomen was performed. Heavily T2-weighted images of the biliary and pancreatic ducts were obtained, and three-dimensional MRCP images were rendered by post processing. COMPARISON:  Ultrasound earlier the same date. FINDINGS: Study is mildly motion degraded.  No contrast was administered. Lower chest: Mild atelectasis at both lung bases. No significant pleural effusion. Hepatobiliary: No focal hepatic abnormalities are identified on noncontrast imaging. No normal gallbladder is identified. I believe there is a mildly dilated cystic duct remnant status post presumed cholecystectomy. There is mild intra and extrahepatic biliary dilatation. The common bile duct measures up to 12 mm in diameter. There is a large calculus in the distal common bile duct. This demonstrates low T2 and high T1 signal and may be calcified. It measures at least 1.7 cm on coronal image 12 of series 16. No other intraductal calculi seen. Pancreas: Unremarkable. No pancreatic ductal dilatation or surrounding inflammation. No evidence of pancreas divisum. Spleen: Normal in size without focal abnormality. Adrenals/Urinary Tract: Both adrenal glands appear normal. There are small renal cysts bilaterally. There is nonspecific perinephric soft tissue stranding bilaterally. No hydronephrosis. Stomach/Bowel: No evidence of bowel wall thickening, distention or surrounding inflammatory change. Vascular/Lymphatic: There are no enlarged abdominal lymph nodes. No significant vascular findings are seen. Other: None. Musculoskeletal: No acute or significant osseous findings. Mild lumbar spondylosis. IMPRESSION: 1. Large obstructing calculus in the distal common bile duct with associated biliary dilatation. ERCP recommended. 2. The gallbladder is not clearly seen status post presumed cholecystectomy. The cystic duct remnant appears mildly dilated. Evaluation limited by breathing artifact.  3. No evidence of pancreatitis or pancreatic ductal dilatation. Electronically Signed   By: Richardean Sale M.D.   On: 10/11/2015 18:51   Dg Abd Acute W/chest  10/11/2015  CLINICAL DATA:  Abdominal pain and vomiting, duration 1 hour. EXAM: DG ABDOMEN ACUTE W/ 1V CHEST COMPARISON:  None. FINDINGS: The abdominal gas pattern negative for obstruction perforation. No biliary or urinary calculi are evident. The upright view the chest demonstrates mild atelectatic appearing basilar lung opacities bilaterally. IMPRESSION: Negative for obstruction or perforation. Atelectatic appearing lung base opacities bilaterally. Electronically Signed   By: Andreas Newport M.D.   On: 10/11/2015 06:46    Assessment:   Kallum Jesse Holden is a 80 y.o. male with ESBL E coli bacteremia in association with biliary obstruction.  Unfortunately no oral options available.   Recommendations Place PICC Suggest 10 days ertapenem from today - stop date 12/15.  Will need weekly labs At end of therapy unless complications arise can have PICC removed.  FU PCP    Thank you very much for allowing me to participate in the care of this patient. Please call with questions.   Cheral Marker. Ola Spurr, MD

## 2015-10-13 NOTE — Progress Notes (Signed)
Infectious Disease Long Term IV Antibiotic Orders  Diagnosis:ESBL E Coli bacteremia, biliary source  Culture results E coli  Allergies: No Known Allergies  Discharge antibiotics Ertapenem 1 gm q 24 hrs   PICC Care per protocol Labs weekly while on IV antibiotics      CBC w diff   Comprehensive met panel   Planned duration of antibiotics 10 days  Stop date 10/23/15    Follow up clinic date NO ID FU needed Pull picc at completion of ertapenem  FAX weekly labs to [phone]  Leonel Ramsay, MD

## 2015-10-13 NOTE — Progress Notes (Signed)
Notified William Holden of PICC line placement. Awaiting callback and arrival for placement.  Pt given IV Invanz.Resting in bed. Continue to assess.

## 2015-10-14 LAB — COMPREHENSIVE METABOLIC PANEL
ALK PHOS: 315 U/L — AB (ref 38–126)
ALT: 82 U/L — AB (ref 17–63)
AST: 46 U/L — AB (ref 15–41)
Albumin: 2.2 g/dL — ABNORMAL LOW (ref 3.5–5.0)
Anion gap: 5 (ref 5–15)
BUN: 24 mg/dL — AB (ref 6–20)
CALCIUM: 7.3 mg/dL — AB (ref 8.9–10.3)
CHLORIDE: 110 mmol/L (ref 101–111)
CO2: 22 mmol/L (ref 22–32)
CREATININE: 1.61 mg/dL — AB (ref 0.61–1.24)
GFR, EST AFRICAN AMERICAN: 46 mL/min — AB (ref >60)
GFR, EST NON AFRICAN AMERICAN: 40 mL/min — AB (ref >60)
Glucose, Bld: 116 mg/dL — ABNORMAL HIGH (ref 65–99)
Potassium: 3.2 mmol/L — ABNORMAL LOW (ref 3.5–5.1)
Sodium: 137 mmol/L (ref 135–145)
Total Bilirubin: 2.1 mg/dL — ABNORMAL HIGH (ref 0.3–1.2)
Total Protein: 5.8 g/dL — ABNORMAL LOW (ref 6.5–8.1)

## 2015-10-14 LAB — CBC WITH DIFFERENTIAL/PLATELET
BASOS PCT: 1 %
Basophils Absolute: 0 10*3/uL (ref 0–0.1)
EOS ABS: 0.2 10*3/uL (ref 0–0.7)
EOS PCT: 3 %
HCT: 34.2 % — ABNORMAL LOW (ref 40.0–52.0)
Hemoglobin: 11.8 g/dL — ABNORMAL LOW (ref 13.0–18.0)
LYMPHS ABS: 1 10*3/uL (ref 1.0–3.6)
Lymphocytes Relative: 15 %
MCH: 31.8 pg (ref 26.0–34.0)
MCHC: 34.5 g/dL (ref 32.0–36.0)
MCV: 92.2 fL (ref 80.0–100.0)
MONOS PCT: 6 %
Monocytes Absolute: 0.4 10*3/uL (ref 0.2–1.0)
NEUTROS PCT: 75 %
Neutro Abs: 5 10*3/uL (ref 1.4–6.5)
PLATELETS: 158 10*3/uL (ref 150–440)
RBC: 3.71 MIL/uL — AB (ref 4.40–5.90)
RDW: 14.5 % (ref 11.5–14.5)
WBC: 6.6 10*3/uL (ref 3.8–10.6)

## 2015-10-14 MED ORDER — ONDANSETRON HCL 4 MG PO TABS: 4.0000 mg | ORAL_TABLET | Freq: Four times a day (QID) | ORAL | Status: DC | PRN

## 2015-10-14 MED ORDER — SODIUM CHLORIDE 0.9 % IV SOLN: 1.0000 g | INTRAVENOUS | Status: AC

## 2015-10-14 MED ORDER — POTASSIUM CHLORIDE CRYS ER 20 MEQ PO TBCR
40.0000 meq | EXTENDED_RELEASE_TABLET | Freq: Once | ORAL | Status: AC
  Administered 2015-10-14: 40 meq via ORAL
  Filled 2015-10-14: qty 2

## 2015-10-14 NOTE — Discharge Summary (Signed)
[facility] - [place] at [facility]   PATIENT NAME: William Holden    MR#:  [id number]  DATE OF BIRTH:  Sep 15, 1935  DATE OF ADMISSION:  10/11/2015 ADMITTING PHYSICIAN: Arnaldo Natal, MD  DATE OF DISCHARGE: 10/14/15  PRIMARY CARE PHYSICIAN: Imelda Pillow, NP    ADMISSION DIAGNOSIS:  Pancreatitis [K85.9] Epigastric pain [R10.13] Renal insufficiency [N28.9] Elevated liver enzymes [R74.8] Acute pancreatitis, unspecified pancreatitis type [K85.9] Non-intractable vomiting with nausea, vomiting of unspecified type [R11.2]  DISCHARGE DIAGNOSIS:  Principal Problem:   Choledocholithiasis Active Problems:   Pancreatitis   Abnormal [place] (ultrasound) of abdomen   Pancreatitis, acute   Elevated liver enzymes   Epigastric pain   Biliary colic   Disease of biliary tract   Duodenal ulcer   SECONDARY DIAGNOSIS:   Past Medical History  Diagnosis Date  . Bowel obstruction [facility])     HOSPITAL COURSE:   80 year old Hispanic male admitted for acute pancreatitis and acute hepatitis.  1. Acute pancreatitis: suspected due to cholestatic jaundice -From choledocholithiasis. Patient is status post cholecystectomy. -MRCP revealed dilated common bile duct and a stone in the duct. Appreciate GI consult. -ERCP done and stone removed. No other stones seen. -Tolerating diet well. No complaints of abdominal pain or nausea. Pancreatitis resolved. - LFTs improving, outpatient f/u recommended  2. Sepsis: secondary to biliary pancreatitis - blood cultures growing ESBL E.coli and on IV invanz for a total of 10 days - PICC line placed - appreciate ID consult - Home health nursing for IV ABX at home  3. Acute kidney injury: Possibly multifactorial including pancreatitis as well as dehydration.  Avoid nephrotoxic drugs. -Slow improvement with fluids noted. Recheck as outpatient.  4. Acute hepatitis: From obstructive jaundice, from choledocholithiasis. -Stone  extraction done. Labs improving. Follow-up labs in 1 week  5. Duodenal ulcerations-noted on ERCP, incidentally. -Will start on Prilosec. If symptoms recur, H pylori testing can be done.  Discharge home today   DISCHARGE CONDITIONS:   stable  CONSULTS OBTAINED:  Treatment Team:  Enedina Finner, MD Clydie Braun, MD  DRUG ALLERGIES:  No Known Allergies  DISCHARGE MEDICATIONS:   Current Discharge Medication List    START taking these medications   Details  ertapenem 1 g in sodium chloride 0.9 % 50 mL Inject 1 g into the vein daily. Qty: 9 g, Refills: 0    omeprazole (PRILOSEC OTC) 20 MG tablet Take 1 tablet (20 mg total) by mouth daily. Qty: 30 tablet, Refills: 2    ondansetron (ZOFRAN) 4 MG tablet Take 1 tablet (4 mg total) by mouth every 6 (six) hours as needed for nausea. Qty: 20 tablet, Refills: 0         DISCHARGE INSTRUCTIONS:   1. PCP f/u in 1 week 2. Home health nursing for IV antibiotics 3. GI f/u in 4 weeks for post ERCP follow up  If you experience worsening of your admission symptoms, develop shortness of breath, life threatening emergency, suicidal or homicidal thoughts you must seek medical attention immediately by calling 911 or calling your MD immediately  if symptoms less severe.  You Must read complete instructions/literature along with all the possible adverse reactions/side effects for all the Medicines you take and that have been prescribed to you. Take any new Medicines after you have completely understood and accept all the possible adverse reactions/side effects.   Please note  You were cared for by a hospitalist during your hospital stay. If you have any questions about your discharge medications  or the care you received while you were in the hospital after you are discharged, you can call the unit and asked to speak with the hospitalist on call if the hospitalist that took care of you is not available. Once you are discharged, your primary care  physician will handle any further medical issues. Please note that NO REFILLS for any discharge medications will be authorized once you are discharged, as it is imperative that you return to your primary care physician (or establish a relationship with a primary care physician if you do not have one) for your aftercare needs so that they can reassess your need for medications and monitor your lab values.    Today   CHIEF COMPLAINT:   Chief Complaint  Patient presents with  . Abdominal Pain  . Emesis     VITAL SIGNS:  Blood pressure 137/74, pulse 58, temperature 98.3 F (36.8 C), temperature source Oral, resp. rate 20, height 5\' 4"  (1.626 m), weight 57.244 kg (126 lb 3.2 oz), SpO2 98 %.  I/O:   Intake/Output Summary (Last 24 hours) at 10/14/15 1257 Last data filed at 10/14/15 1100  Gross per 24 hour  Intake  867.5 ml  Output   1000 ml  Net -132.5 ml    PHYSICAL EXAMINATION:   Physical Exam  GENERAL:  80 y.o.-year-old patient lying in the bed with no acute distress.  EYES: Pupils equal, round, reactive to light and accommodation. No scleral icterus. Extraocular muscles intact.  HEENT: Head atraumatic, normocephalic. Oropharynx and nasopharynx clear.  NECK:  Supple, no jugular venous distention. No thyroid enlargement, no tenderness.  LUNGS: Normal breath sounds bilaterally, no wheezing, rales,rhonchi or crepitation. No use of accessory muscles of respiration.  CARDIOVASCULAR: S1, S2 normal. No murmurs, rubs, or gallops.  ABDOMEN: Soft, non-tender, non-distended. Bowel sounds present. No organomegaly or mass.  EXTREMITIES: No pedal edema, cyanosis, or clubbing.  NEUROLOGIC: Cranial nerves II through XII are intact. Muscle strength 5/5 in all extremities. Sensation intact. Gait not checked.  PSYCHIATRIC: The patient is alert and oriented x 3.  SKIN: No obvious rash, lesion, or ulcer.   DATA REVIEW:   CBC  Recent Labs Lab 10/14/15 0511  WBC 6.6  HGB 11.8*  HCT 34.2*   PLT 158    Chemistries   Recent Labs Lab 10/14/15 0511  NA 137  K 3.2*  CL 110  CO2 22  GLUCOSE 116*  BUN 24*  CREATININE 1.61*  CALCIUM 7.3*  AST 46*  ALT 82*  ALKPHOS 315*  BILITOT 2.1*    Cardiac Enzymes  Recent Labs Lab 10/11/15 0516  TROPONINI <0.03    Microbiology Results  Results for orders placed or performed during the hospital encounter of 10/11/15  Culture, blood (routine x 2)     Status: None   Collection Time: 10/11/15  8:10 AM  Result Value Ref Range Status   Specimen Description BLOOD LEFT AC  Final   Special Requests BOTTLES DRAWN AEROBIC AND ANAEROBIC AER4CC,ANA1CC  Final   Culture  Setup Time   Final    GRAM NEGATIVE RODS IN BOTH AEROBIC AND ANAEROBIC BOTTLES CRITICAL RESULT CALLED TO, READ BACK BY AND VERIFIED WITH: ASHLEY ANDREW AT 2020 ON 10/11/15 RWW CONFIRMED BY [facility]    Culture   Final    ESCHERICHIA COLI Susceptibility Pattern Suggests Possibility of an Extended Spectrum Beta Lactamase Producer. Contact Laboratory Within 7 Days if Confirmation Warranted. IN BOTH AEROBIC AND ANAEROBIC BOTTLES CRITICAL RESULT CALLED TO, READ BACK BY AND  VERIFIED WITH: RN Basil Dess HARRIS 10/13/15 0912 MLM    Report Status 10/13/2015 FINAL  Final   Organism ID, Bacteria ESCHERICHIA COLI  Final      Susceptibility   Escherichia coli - MIC*    AMPICILLIN >=32 RESISTANT Resistant     CEFTAZIDIME 4 RESISTANT Resistant     CEFAZOLIN >=64 RESISTANT Resistant     CEFTRIAXONE 16 RESISTANT Resistant     CIPROFLOXACIN >=4 RESISTANT Resistant     GENTAMICIN >=16 RESISTANT Resistant     IMIPENEM <=0.25 SENSITIVE Sensitive     TRIMETH/SULFA >=320 RESISTANT Resistant     Extended ESBL POSITIVE Resistant     PIP/TAZO Value in next row Sensitive      SENSITIVE<=4    * ESCHERICHIA COLI  Culture, blood (routine x 2)     Status: None   Collection Time: 10/11/15  8:17 AM  Result Value Ref Range Status   Specimen Description BLOOD RIGHT AC  Final   Special Requests  BOTTLES DRAWN AEROBIC AND ANAEROBIC AER5CC,ANA1CC  Final   Culture  Setup Time   Final    GRAM NEGATIVE RODS IN BOTH AEROBIC AND ANAEROBIC BOTTLES CRITICAL RESULT CALLED TO, READ BACK BY AND VERIFIED WITH: ASHLEY ANDREW AT 2020 ON 10/11/15 RWW CONFIRMED BY [facility]    Culture   Final    ESCHERICHIA COLI IN BOTH AEROBIC AND ANAEROBIC BOTTLES Susceptibility Pattern Suggests Possibility of an Extended Spectrum Beta Lactamase Producer. Contact Laboratory Within 7 Days if Confirmation Warranted. CRITICAL RESULT CALLED TO, READ BACK BY AND VERIFIED WITH: RN ZANETA HARRIS 10/13/15 0912 MLM    Report Status 10/13/2015 FINAL  Final   Organism ID, Bacteria ESCHERICHIA COLI  Final      Susceptibility   Escherichia coli - MIC*    AMPICILLIN Value in next row Resistant      RESISTANT>=32    CEFTAZIDIME Value in next row Resistant      RESISTANT4    CEFTRIAXONE Value in next row Resistant      RESISTANT16    CIPROFLOXACIN Value in next row Resistant      RESISTANT>=4    GENTAMICIN Value in next row Resistant      RESISTANT>=16    IMIPENEM Value in next row Sensitive      SENSITIVE<=0.25    LEVOFLOXACIN Value in next row Resistant      RESISTANT>=8    NITROFURANTOIN Value in next row Intermediate      INTERMEDIATE64    TRIMETH/SULFA Value in next row Resistant      RESISTANT>=320    PIP/TAZO Value in next row Sensitive      SENSITIVE<=4    Extended ESBL Value in next row Resistant      SENSITIVE<=4    CEFAZOLIN Value in next row Resistant      RESISTANT>=64    * ESCHERICHIA COLI IN BOTH AEROBIC AND ANAEROBIC BOTTLES    RADIOLOGY:  No results found.  EKG:   Orders placed or performed during the hospital encounter of 10/11/15  . ED EKG  . ED EKG      Management plans discussed with the patient, family and they are in agreement.  CODE STATUS:     Code Status Orders        Start     Ordered   10/11/15 1039  Full code   Continuous     10/11/15 1038      TOTAL TIME TAKING  CARE OF THIS PATIENT: 37 minutes.  Edie Vallandingham M.D on 10/14/2015 at 12:57 PM  Between 7am to 6pm - Pager - [phone]  After 6pm go to [email or web address] - password [email or web address] [facility]  [facility]  Office  [phone]  CC: Primary care physician; Imelda Pillow, NP

## 2015-10-14 NOTE — Care Management (Signed)
Patient set up for Home Health antibiotic therapy with [facility] . Patient will have $4.80 co pay. Patient will need interpreter services. Spoke with patient and family at the bedside suing interpreter. Son and Daughter will be able to help patient. Patient lives with wife who is able to help. Family stated she is in good health. Family member will be available to learn how to give IV medications. Patient has PICC line. Patient will discharge today after receiving afternoon dose of Invanz. Feliberto GottronJason Hinton from [facility] will come to room to discuss home care.

## 2015-10-14 NOTE — Progress Notes (Signed)
Patient has a PICC line. It was placed on 10/13/15 during night shift. - today is 10/14/15 day shift. Did not see documentation section for the picc and tried to insert a row in the flowsheets, here is documentation on it. Clean dry intact, infusing.

## 2015-10-14 NOTE — Progress Notes (Signed)
Pt alert and oriented. Discharge summary given to pt. and family. Interpreter utilized. Concerns addressed. IV site removed. PICC line clean and dry, remained in place. Prescriptions given. Discharged via nursing staff.

## 2015-11-13 ENCOUNTER — Encounter: Payer: Self-pay | Admitting: Gastroenterology

## 2015-11-13 ENCOUNTER — Ambulatory Visit (INDEPENDENT_AMBULATORY_CARE_PROVIDER_SITE_OTHER): Payer: Medicare Other | Admitting: Gastroenterology

## 2015-11-13 VITALS — BP 113/67 | HR 53 | Temp 98.0°F | Wt 125.0 lb

## 2015-11-13 DIAGNOSIS — K8050 Calculus of bile duct without cholangitis or cholecystitis without obstruction: Secondary | ICD-10-CM

## 2015-11-13 NOTE — Progress Notes (Signed)
   Primary Care Physician: Imelda PillowHOLLAND, CHELSA, NP  Primary Gastroenterologist:  Dr. Midge Miniumarren Barett Whidbee  Chief Complaint  Patient presents with  . Follow up ERCP    HPI: William Holden is a 81 y.o. male here For follow-up after having an ERCP and a large common bile duct stone removed. The patient states he has been feeling well since then without any reports of any change in the color of his urine or stool. He also reports that he has had not had any jaundice. The patient has been feeling well and has no complaint the present time.  Current Outpatient Prescriptions  Medication Sig Dispense Refill  . omeprazole (PRILOSEC OTC) 20 MG tablet Take 1 tablet (20 mg total) by mouth daily. 30 tablet 2  . ondansetron (ZOFRAN) 4 MG tablet Take 1 tablet (4 mg total) by mouth every 6 (six) hours as needed for nausea. 20 tablet 0   No current facility-administered medications for this visit.    Allergies as of 11/13/2015  . (No Known Allergies)    ROS:  General: Negative for anorexia, weight loss, fever, chills, fatigue, weakness. ENT: Negative for hoarseness, difficulty swallowing , nasal congestion. CV: Negative for chest pain, angina, palpitations, dyspnea on exertion, peripheral edema.  Respiratory: Negative for dyspnea at rest, dyspnea on exertion, cough, sputum, wheezing.  GI: See history of present illness. GU:  Negative for dysuria, hematuria, urinary incontinence, urinary frequency, nocturnal urination.  Endo: Negative for unusual weight change.    Physical Examination:   BP 113/67 mmHg  Pulse 53  Temp(Src) 98 F (36.7 C) (Oral)  Wt 125 lb (56.7 kg)  General: Well-nourished, well-developed in no acute distress.  Eyes: No icterus. Conjunctivae pink. Mouth: Oropharyngeal mucosa moist and pink , no lesions erythema or exudate. Lungs: Clear to auscultation bilaterally. Non-labored. Heart: Regular rate and rhythm, no murmurs rubs or gallops.  Abdomen: Bowel sounds are normal,  nontender, nondistended, no hepatosplenomegaly or masses, no abdominal bruits or hernia , no rebound or guarding.   Extremities: No lower extremity edema. No clubbing or deformities. Neuro: Alert and oriented x 3.  Grossly intact. Skin: Warm and dry, no jaundice.   Psych: Alert and cooperative, normal mood and affect.  Labs:    Imaging Studies: No results found.  Assessment and Plan:   William Holden is a 81 y.o. y/o male who comes today with a history of a common bile duct stone that was removed with an ERCP. The patient's hospital stay was non-complicated and the patient states he is doing well at the present time. The patient has been told to follow-up with me if he has any further GI problems.   Note: This dictation was prepared with Dragon dictation along with smaller phrase technology. Any transcriptional errors that result from this process are unintentional.

## 2015-12-29 DIAGNOSIS — Z0000 Encounter for general adult medical examination without abnormal findings: Secondary | ICD-10-CM | POA: Diagnosis not present

## 2015-12-29 DIAGNOSIS — N183 Chronic kidney disease, stage 3 (moderate): Secondary | ICD-10-CM | POA: Diagnosis not present

## 2015-12-29 DIAGNOSIS — E785 Hyperlipidemia, unspecified: Secondary | ICD-10-CM | POA: Diagnosis not present

## 2015-12-29 DIAGNOSIS — H905 Unspecified sensorineural hearing loss: Secondary | ICD-10-CM | POA: Diagnosis not present

## 2016-06-15 DIAGNOSIS — N183 Chronic kidney disease, stage 3 (moderate): Secondary | ICD-10-CM | POA: Diagnosis not present

## 2016-06-15 DIAGNOSIS — I10 Essential (primary) hypertension: Secondary | ICD-10-CM | POA: Diagnosis not present

## 2016-06-15 DIAGNOSIS — R809 Proteinuria, unspecified: Secondary | ICD-10-CM | POA: Diagnosis not present

## 2016-07-01 DIAGNOSIS — N183 Chronic kidney disease, stage 3 (moderate): Secondary | ICD-10-CM | POA: Diagnosis not present

## 2016-07-01 DIAGNOSIS — E785 Hyperlipidemia, unspecified: Secondary | ICD-10-CM | POA: Diagnosis not present

## 2016-07-14 DIAGNOSIS — H905 Unspecified sensorineural hearing loss: Secondary | ICD-10-CM | POA: Diagnosis not present

## 2016-07-14 DIAGNOSIS — N183 Chronic kidney disease, stage 3 (moderate): Secondary | ICD-10-CM | POA: Diagnosis not present

## 2016-07-14 DIAGNOSIS — E559 Vitamin D deficiency, unspecified: Secondary | ICD-10-CM | POA: Diagnosis not present

## 2016-07-14 DIAGNOSIS — E785 Hyperlipidemia, unspecified: Secondary | ICD-10-CM | POA: Diagnosis not present

## 2016-07-14 DIAGNOSIS — R339 Retention of urine, unspecified: Secondary | ICD-10-CM | POA: Diagnosis not present

## 2016-07-14 DIAGNOSIS — E875 Hyperkalemia: Secondary | ICD-10-CM | POA: Diagnosis not present

## 2016-07-16 DIAGNOSIS — Z23 Encounter for immunization: Secondary | ICD-10-CM | POA: Diagnosis not present

## 2017-01-18 DIAGNOSIS — N183 Chronic kidney disease, stage 3 (moderate): Secondary | ICD-10-CM | POA: Diagnosis not present

## 2017-01-18 DIAGNOSIS — E875 Hyperkalemia: Secondary | ICD-10-CM | POA: Diagnosis not present

## 2017-01-18 DIAGNOSIS — I10 Essential (primary) hypertension: Secondary | ICD-10-CM | POA: Diagnosis not present

## 2017-01-18 DIAGNOSIS — E785 Hyperlipidemia, unspecified: Secondary | ICD-10-CM | POA: Diagnosis not present

## 2017-01-21 DIAGNOSIS — N183 Chronic kidney disease, stage 3 (moderate): Secondary | ICD-10-CM | POA: Diagnosis not present

## 2017-01-21 DIAGNOSIS — H905 Unspecified sensorineural hearing loss: Secondary | ICD-10-CM | POA: Diagnosis not present

## 2017-01-25 DIAGNOSIS — H401133 Primary open-angle glaucoma, bilateral, severe stage: Secondary | ICD-10-CM | POA: Diagnosis not present

## 2017-03-01 DIAGNOSIS — H401133 Primary open-angle glaucoma, bilateral, severe stage: Secondary | ICD-10-CM | POA: Diagnosis not present

## 2017-06-29 DIAGNOSIS — N183 Chronic kidney disease, stage 3 (moderate): Secondary | ICD-10-CM | POA: Diagnosis not present

## 2017-07-13 DIAGNOSIS — H401133 Primary open-angle glaucoma, bilateral, severe stage: Secondary | ICD-10-CM | POA: Diagnosis not present

## 2017-07-27 DIAGNOSIS — H401133 Primary open-angle glaucoma, bilateral, severe stage: Secondary | ICD-10-CM | POA: Diagnosis not present

## 2017-09-07 DIAGNOSIS — H401133 Primary open-angle glaucoma, bilateral, severe stage: Secondary | ICD-10-CM | POA: Diagnosis not present

## 2017-09-28 IMAGING — MR MR MRCP
9 of 12 series · 33 of 48 positions shown · non-contrast
Comparison: Ultrasound earlier the same date.

CLINICAL DATA: Abdominal pain. Pancreatitis with elevated lipase
and liver function studies. Abnormal appearance of the liver on
ultrasound.

EXAM:
MRI ABDOMEN WITHOUT CONTRAST  (INCLUDING MRCP)
TECHNIQUE: Multiplanar multisequence MR imaging of the abdomen was performed.
Heavily T2-weighted images of the biliary and pancreatic ducts were
obtained, and three-dimensional MRCP images were rendered by post
processing.

[Series 3: T2 · coronal · 8.0mm · 0.78mm/px · 1 of 24 slices shown (1 of 2)]
[im 1/24]
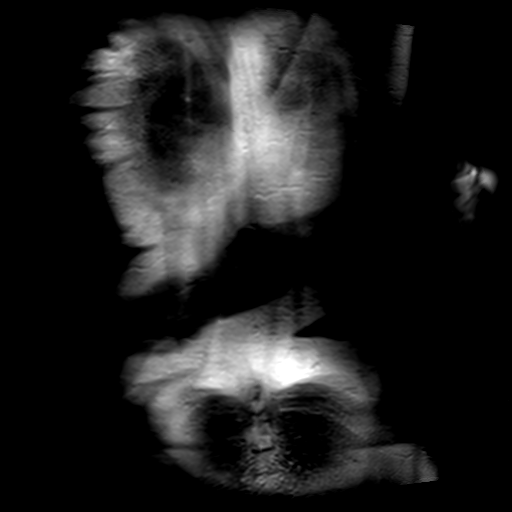

[Series 4: T2 fat-sat · axial · 7.0mm · 0.74mm/px · z∈[-69,+158]mm · 2 of 28 slices shown]
[im 1/28]
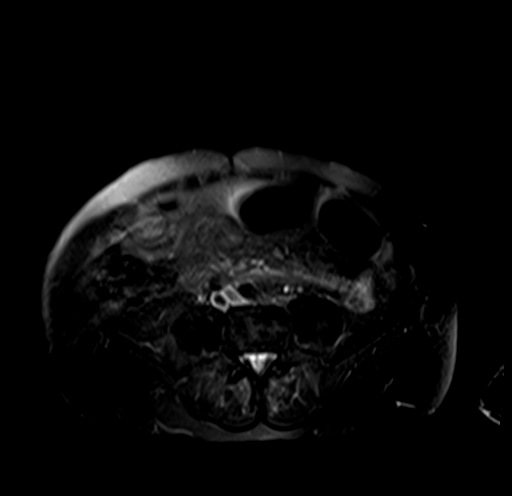
[im 28/28]
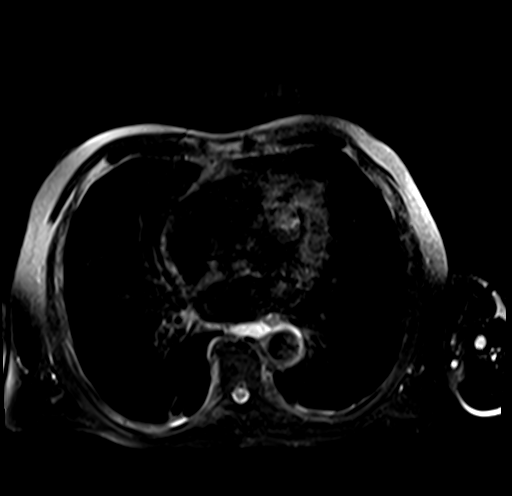

[Series 8: T1 dynamic fat-sat · axial · non-contrast · 2.5mm · 0.74mm/px · z∈[-72,+145]mm · 8 of 88 slices shown]
[im 1/88]
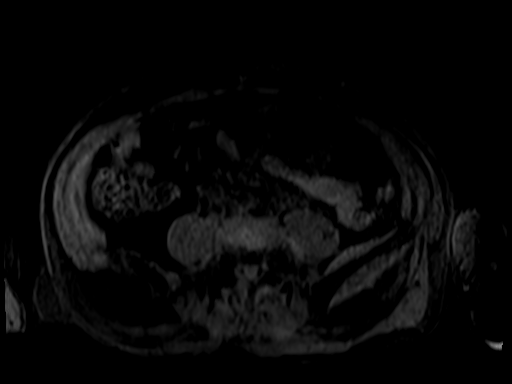
[im 13/88]
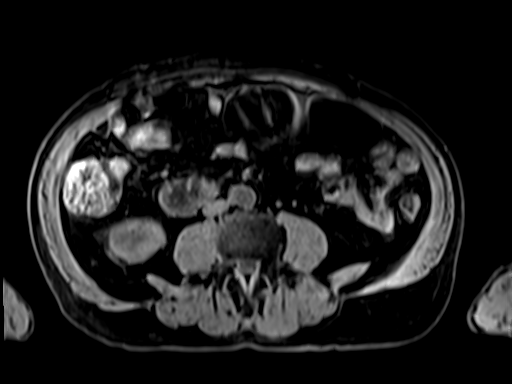
[im 25/88]
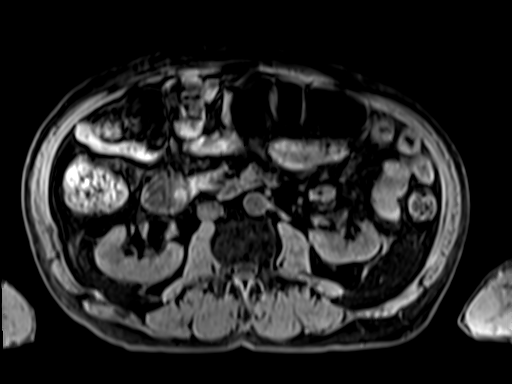
[im 38/88]
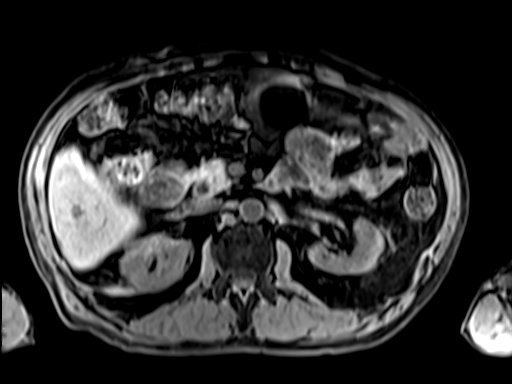
[im 50/88]
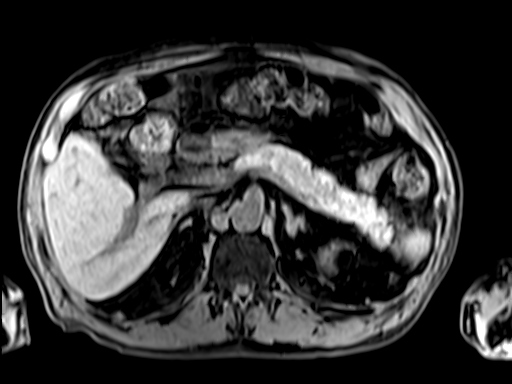
[im 63/88]
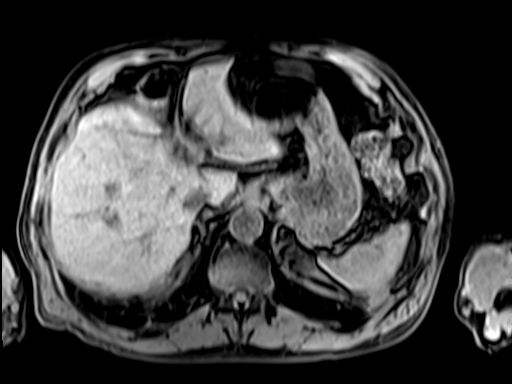
[im 75/88]
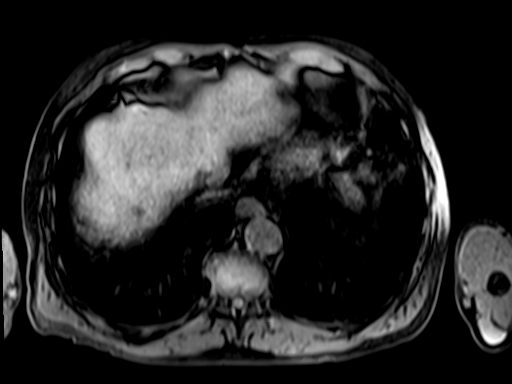
[im 88/88]
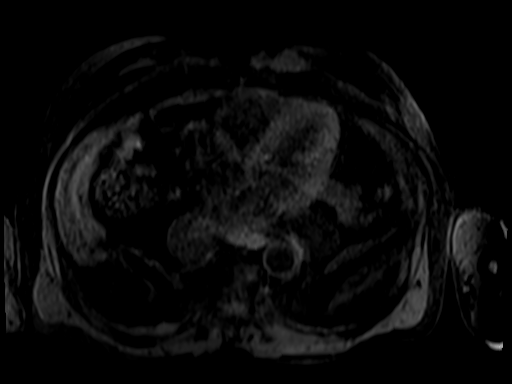

[Series 10: DWI · axial · 6.0mm · 1.98mm/px · z∈[-69,+176]mm · 8 of 104 slices shown]
[im 1/104]
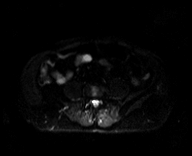
[im 12/104]
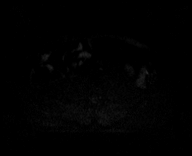
[im 35/104]
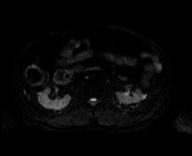
[im 46/104]
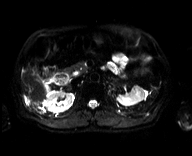
[im 58/104]
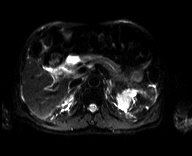
[im 69/104]
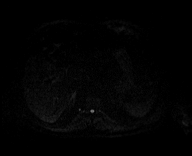
[im 92/104]
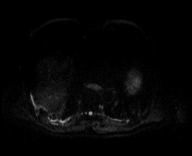
[im 104/104]
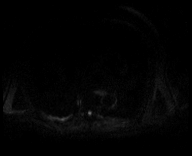

[Series 11: ax dwi_adc · axial · 6.0mm · 1.98mm/px · z∈[-69,+176]mm · 3 of 35 slices shown]
[im 1/35]
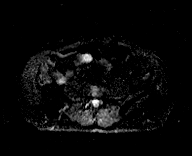
[im 18/35]
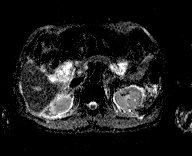
[im 35/35]
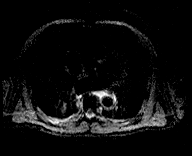

[Series 12: T2 · axial · 7.0mm · 0.74mm/px · z∈[-69,+158]mm · 3 of 28 slices shown (2 of 2)]
[im 1/28]
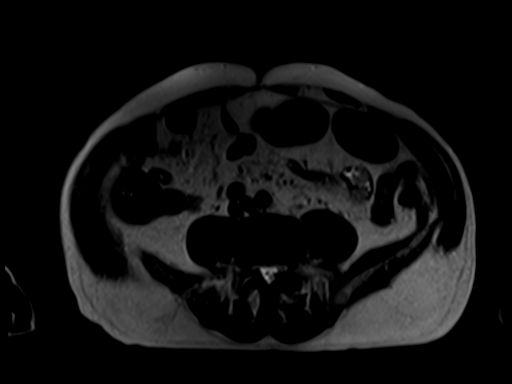
[im 14/28]
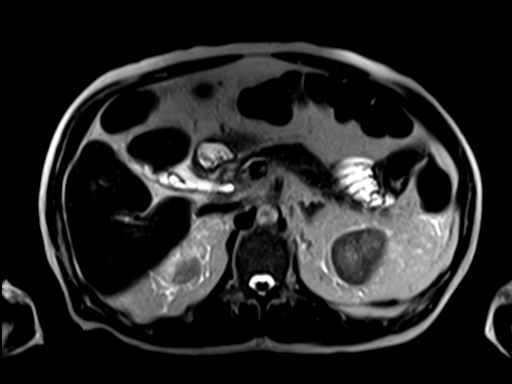
[im 28/28]
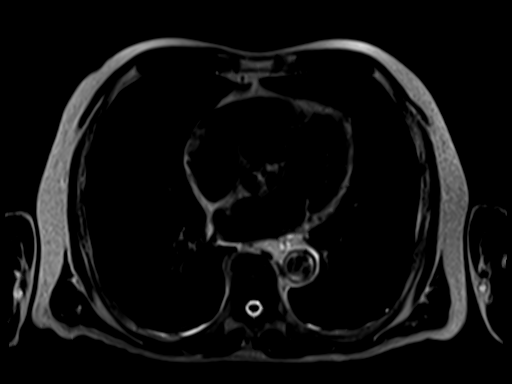

[Series 16: cor thins · coronal · 4.0mm · 0.89mm/px · 2 of 17 slices shown]
[im 1/17]
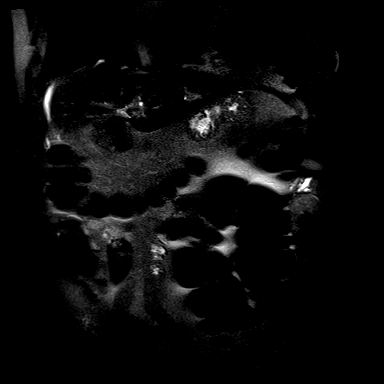
[im 17/17]
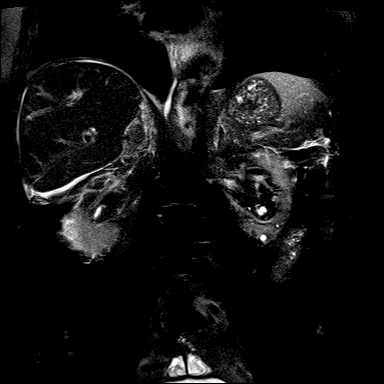

[Series 17: MRCP · coronal · 40.0mm · 0.91mm/px · 1 of 6 slices shown]
[im 1/6]
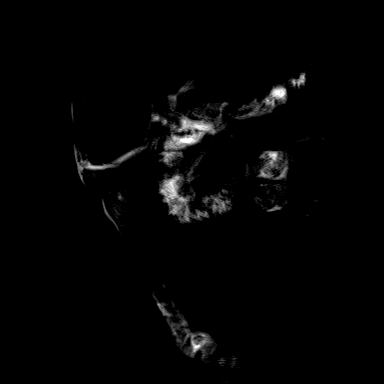

[Series 18: cor tru fisp · coronal · 4.0mm · 0.78mm/px · 5 of 60 slices shown]
[im 1/60]
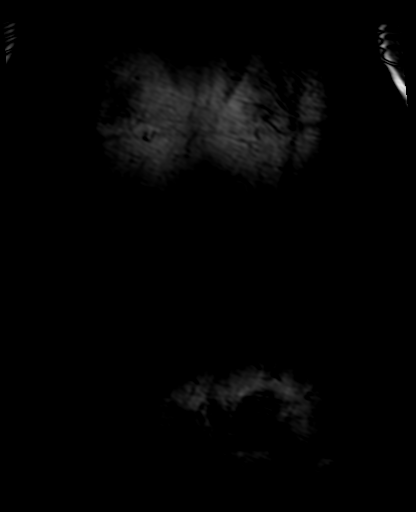
[im 12/60]
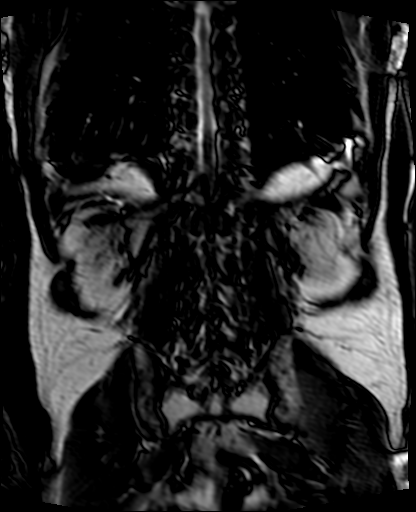
[im 24/60]
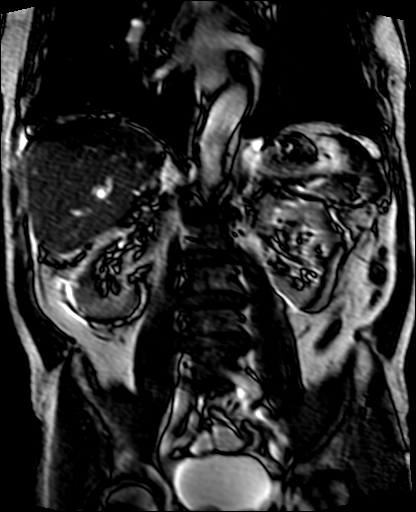
[im 36/60]
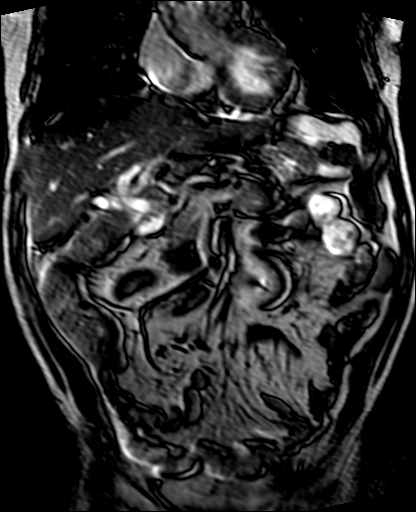
[im 48/60]
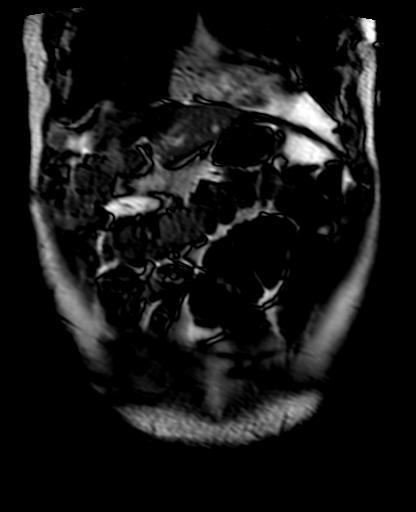

[33 of 48 positions shown; findings below may reference images not displayed]

FINDINGS: Study is mildly motion degraded.  No contrast was administered.

Lower chest: Mild atelectasis at both lung bases. No significant
pleural effusion.

Hepatobiliary: No focal hepatic abnormalities are identified on
noncontrast imaging. No normal gallbladder is identified. I believe
there is a mildly dilated cystic duct remnant status post presumed
cholecystectomy. There is mild intra and extrahepatic biliary
dilatation. The common bile duct measures up to 12 mm in diameter.
There is a large calculus in the distal common bile duct. This
demonstrates low T2 and high T1 signal and may be calcified. It
measures at least 1.7 cm on coronal image 12 of series 16. No other
intraductal calculi seen.

Pancreas: Unremarkable. No pancreatic ductal dilatation or
surrounding inflammation. No evidence of pancreas divisum.

Spleen: Normal in size without focal abnormality.

Adrenals/Urinary Tract: Both adrenal glands appear normal. There are
small renal cysts bilaterally. There is nonspecific perinephric soft
tissue stranding bilaterally. No hydronephrosis.

Stomach/Bowel: No evidence of bowel wall thickening, distention or
surrounding inflammatory change.

Vascular/Lymphatic: There are no enlarged abdominal lymph nodes. No
significant vascular findings are seen.

Other: None.

Musculoskeletal: No acute or significant osseous findings. Mild
lumbar spondylosis.
IMPRESSION: 1. Large obstructing calculus in the distal common bile duct with
associated biliary dilatation. ERCP recommended.
2. The gallbladder is not clearly seen status post presumed
cholecystectomy. The cystic duct remnant appears mildly dilated.
Evaluation limited by breathing artifact.
3. No evidence of pancreatitis or pancreatic ductal dilatation.

## 2018-06-26 DIAGNOSIS — R809 Proteinuria, unspecified: Secondary | ICD-10-CM | POA: Diagnosis not present

## 2018-06-26 DIAGNOSIS — N183 Chronic kidney disease, stage 3 (moderate): Secondary | ICD-10-CM | POA: Diagnosis not present

## 2018-07-07 DIAGNOSIS — E663 Overweight: Secondary | ICD-10-CM | POA: Diagnosis not present

## 2018-07-07 DIAGNOSIS — R339 Retention of urine, unspecified: Secondary | ICD-10-CM | POA: Diagnosis not present

## 2018-07-12 DIAGNOSIS — N189 Chronic kidney disease, unspecified: Secondary | ICD-10-CM | POA: Diagnosis not present

## 2018-07-12 DIAGNOSIS — H9113 Presbycusis, bilateral: Secondary | ICD-10-CM | POA: Diagnosis not present

## 2018-07-12 DIAGNOSIS — E781 Pure hyperglyceridemia: Secondary | ICD-10-CM | POA: Diagnosis not present

## 2019-07-04 DIAGNOSIS — I129 Hypertensive chronic kidney disease with stage 1 through stage 4 chronic kidney disease, or unspecified chronic kidney disease: Secondary | ICD-10-CM | POA: Diagnosis not present

## 2019-07-04 DIAGNOSIS — N183 Chronic kidney disease, stage 3 (moderate): Secondary | ICD-10-CM | POA: Diagnosis not present

## 2019-07-18 DIAGNOSIS — N189 Chronic kidney disease, unspecified: Secondary | ICD-10-CM | POA: Diagnosis not present

## 2019-07-18 DIAGNOSIS — E781 Pure hyperglyceridemia: Secondary | ICD-10-CM | POA: Diagnosis not present

## 2019-07-18 DIAGNOSIS — E559 Vitamin D deficiency, unspecified: Secondary | ICD-10-CM | POA: Diagnosis not present

## 2019-07-18 DIAGNOSIS — R5383 Other fatigue: Secondary | ICD-10-CM | POA: Diagnosis not present

## 2019-07-18 DIAGNOSIS — N401 Enlarged prostate with lower urinary tract symptoms: Secondary | ICD-10-CM | POA: Diagnosis not present

## 2019-09-24 DIAGNOSIS — Z23 Encounter for immunization: Secondary | ICD-10-CM | POA: Diagnosis not present

## 2020-01-08 DIAGNOSIS — N401 Enlarged prostate with lower urinary tract symptoms: Secondary | ICD-10-CM | POA: Diagnosis not present

## 2020-01-08 DIAGNOSIS — E781 Pure hyperglyceridemia: Secondary | ICD-10-CM | POA: Diagnosis not present

## 2020-01-08 DIAGNOSIS — R5383 Other fatigue: Secondary | ICD-10-CM | POA: Diagnosis not present

## 2020-07-30 DIAGNOSIS — Z23 Encounter for immunization: Secondary | ICD-10-CM | POA: Diagnosis not present

## 2020-07-30 DIAGNOSIS — E559 Vitamin D deficiency, unspecified: Secondary | ICD-10-CM | POA: Diagnosis not present

## 2020-07-30 DIAGNOSIS — N189 Chronic kidney disease, unspecified: Secondary | ICD-10-CM | POA: Diagnosis not present

## 2020-07-30 DIAGNOSIS — H9193 Unspecified hearing loss, bilateral: Secondary | ICD-10-CM | POA: Diagnosis not present

## 2020-08-07 DIAGNOSIS — N1832 Chronic kidney disease, stage 3b: Secondary | ICD-10-CM | POA: Diagnosis not present

## 2020-08-07 DIAGNOSIS — I129 Hypertensive chronic kidney disease with stage 1 through stage 4 chronic kidney disease, or unspecified chronic kidney disease: Secondary | ICD-10-CM | POA: Diagnosis not present

## 2020-08-07 DIAGNOSIS — R809 Proteinuria, unspecified: Secondary | ICD-10-CM | POA: Diagnosis not present

## 2020-10-28 ENCOUNTER — Ambulatory Visit: Payer: Medicare Other | Attending: Internal Medicine

## 2020-10-28 DIAGNOSIS — Z23 Encounter for immunization: Secondary | ICD-10-CM

## 2020-10-28 NOTE — Progress Notes (Signed)
° °  Covid-19 Vaccination Clinic  Name:  Laurin Morgenstern    MRN: [id number] DOB: 05-Aug-1935  10/28/2020  Mr. Dimas Deneen Harts was observed post Covid-19 immunization for 15 minutes without incident. He was provided with Vaccine Information Sheet and instruction to access the V-Safe system.   Mr. Alethia Berthold was instructed to call 911 with any severe reactions post vaccine:  Difficulty breathing   Swelling of face and throat   A fast heartbeat   A bad rash all over body   Dizziness and weakness   Immunizations Administered    Name Date Dose VIS Date Route   Pfizer COVID-19 Vaccine 10/28/2020  2:04 PM 0.3 mL 08/27/2020 Intramuscular   Manufacturer: [employer], [employer]   Lot: [id number]   NDC: [address]-2

## 2020-11-11 ENCOUNTER — Other Ambulatory Visit: Payer: Self-pay

## 2020-11-11 DIAGNOSIS — Z20822 Contact with and (suspected) exposure to covid-19: Secondary | ICD-10-CM | POA: Diagnosis not present

## 2020-11-13 ENCOUNTER — Other Ambulatory Visit: Payer: Self-pay

## 2020-11-17 LAB — NOVEL CORONAVIRUS, NAA: SARS-CoV-2, NAA: NOT DETECTED

## 2020-11-17 LAB — SARS-COV-2, NAA 2 DAY TAT

## 2020-11-21 DIAGNOSIS — H401133 Primary open-angle glaucoma, bilateral, severe stage: Secondary | ICD-10-CM | POA: Diagnosis not present

## 2020-12-23 DIAGNOSIS — H401133 Primary open-angle glaucoma, bilateral, severe stage: Secondary | ICD-10-CM | POA: Diagnosis not present

## 2021-01-07 DIAGNOSIS — H401133 Primary open-angle glaucoma, bilateral, severe stage: Secondary | ICD-10-CM | POA: Diagnosis not present

## 2021-01-13 DIAGNOSIS — H401133 Primary open-angle glaucoma, bilateral, severe stage: Secondary | ICD-10-CM | POA: Diagnosis not present

## 2021-01-28 DIAGNOSIS — H2511 Age-related nuclear cataract, right eye: Secondary | ICD-10-CM | POA: Diagnosis not present

## 2021-02-04 ENCOUNTER — Other Ambulatory Visit: Payer: Self-pay

## 2021-02-04 ENCOUNTER — Encounter: Payer: Self-pay | Admitting: Ophthalmology

## 2021-02-05 ENCOUNTER — Other Ambulatory Visit
Admission: RE | Admit: 2021-02-05 | Discharge: 2021-02-05 | Disposition: A | Discharge status: Home / Self Care | Payer: Medicare Other | Source: Ambulatory Visit | Attending: Ophthalmology | Admitting: Ophthalmology

## 2021-02-05 DIAGNOSIS — Z01812 Encounter for preprocedural laboratory examination: Secondary | ICD-10-CM | POA: Diagnosis not present

## 2021-02-05 DIAGNOSIS — Z20822 Contact with and (suspected) exposure to covid-19: Secondary | ICD-10-CM | POA: Diagnosis not present

## 2021-02-05 LAB — SARS CORONAVIRUS 2 (TAT 6-24 HRS): SARS Coronavirus 2: NEGATIVE

## 2021-02-05 NOTE — Discharge Instructions (Signed)

## 2021-02-09 ENCOUNTER — Ambulatory Visit: Payer: Medicare Other | Admitting: Anesthesiology

## 2021-02-09 ENCOUNTER — Other Ambulatory Visit: Payer: Self-pay

## 2021-02-09 ENCOUNTER — Encounter
Admission: RE | Disposition: A | Discharge status: Home / Self Care | Payer: Self-pay | Source: Home / Self Care | Attending: Ophthalmology

## 2021-02-09 ENCOUNTER — Encounter: Payer: Self-pay | Admitting: Ophthalmology

## 2021-02-09 ENCOUNTER — Ambulatory Visit
Admission: RE | Admit: 2021-02-09 | Discharge: 2021-02-09 | Disposition: A | Discharge status: Home / Self Care | Payer: Medicare Other | Attending: Ophthalmology | Admitting: Ophthalmology

## 2021-02-09 DIAGNOSIS — Z87891 Personal history of nicotine dependence: Secondary | ICD-10-CM | POA: Diagnosis not present

## 2021-02-09 DIAGNOSIS — Z9049 Acquired absence of other specified parts of digestive tract: Secondary | ICD-10-CM | POA: Insufficient documentation

## 2021-02-09 DIAGNOSIS — H2511 Age-related nuclear cataract, right eye: Secondary | ICD-10-CM | POA: Insufficient documentation

## 2021-02-09 DIAGNOSIS — H401113 Primary open-angle glaucoma, right eye, severe stage: Secondary | ICD-10-CM | POA: Insufficient documentation

## 2021-02-09 DIAGNOSIS — N183 Chronic kidney disease, stage 3 unspecified: Secondary | ICD-10-CM | POA: Insufficient documentation

## 2021-02-09 DIAGNOSIS — H25811 Combined forms of age-related cataract, right eye: Secondary | ICD-10-CM | POA: Diagnosis not present

## 2021-02-09 HISTORY — PX: CATARACT EXTRACTION W/PHACO: SHX586

## 2021-02-09 HISTORY — DX: Chronic kidney disease, stage 3 unspecified: N18.30

## 2021-02-09 SURGERY — PHACOEMULSIFICATION, CATARACT, WITH IOL INSERTION
Anesthesia: Monitor Anesthesia Care | Site: Eye | Laterality: Right

## 2021-02-09 MED ORDER — MIDAZOLAM HCL 2 MG/2ML IJ SOLN
INTRAMUSCULAR | Status: DC | PRN
  Administered 2021-02-09: 1 mg via INTRAVENOUS

## 2021-02-09 MED ORDER — NEOMYCIN-POLYMYXIN-DEXAMETH 3.5-10000-0.1 OP OINT
TOPICAL_OINTMENT | OPHTHALMIC | Status: DC | PRN
  Administered 2021-02-09: 1 via OPHTHALMIC

## 2021-02-09 MED ORDER — MOXIFLOXACIN HCL 0.5 % OP SOLN
OPHTHALMIC | Status: DC | PRN
  Administered 2021-02-09: 0.2 mL via OPHTHALMIC

## 2021-02-09 MED ORDER — ARMC OPHTHALMIC DILATING DROPS
1.0000 "application " | OPHTHALMIC | Status: DC | PRN
  Administered 2021-02-09 (×3): 1 via OPHTHALMIC

## 2021-02-09 MED ORDER — TRYPAN BLUE 0.06 % OP SOLN
OPHTHALMIC | Status: DC | PRN
  Administered 2021-02-09: 0.5 mL via INTRAOCULAR

## 2021-02-09 MED ORDER — SODIUM HYALURONATE 23 MG/ML IO SOLN
INTRAOCULAR | Status: DC | PRN
  Administered 2021-02-09: 0.6 mL via INTRAOCULAR

## 2021-02-09 MED ORDER — SODIUM HYALURONATE 10 MG/ML IO SOLN
INTRAOCULAR | Status: DC | PRN
  Administered 2021-02-09: 0.55 mL via INTRAOCULAR

## 2021-02-09 MED ORDER — LIDOCAINE HCL (PF) 2 % IJ SOLN
INTRAOCULAR | Status: DC | PRN
  Administered 2021-02-09: 1 mL via INTRAOCULAR

## 2021-02-09 MED ORDER — ALFENTANIL 500 MCG/ML IJ INJ
INJECTION | INTRAVENOUS | Status: DC | PRN
  Administered 2021-02-09: 500 ug via INTRAVENOUS

## 2021-02-09 MED ORDER — TETRACAINE HCL 0.5 % OP SOLN
1.0000 [drp] | OPHTHALMIC | Status: DC | PRN
  Administered 2021-02-09 (×3): 1 [drp] via OPHTHALMIC

## 2021-02-09 MED ORDER — LIDOCAINE HCL 2 % IJ SOLN
INTRAMUSCULAR | Status: DC | PRN
  Administered 2021-02-09: 4 mL via OPHTHALMIC

## 2021-02-09 MED ORDER — EPINEPHRINE PF 1 MG/ML IJ SOLN
INTRAOCULAR | Status: DC | PRN
  Administered 2021-02-09: 107 mL via OPHTHALMIC

## 2021-02-09 SURGICAL SUPPLY — 31 items
ALLOGRAFT TUTOPLST SCER0.5X1.0 (Tissue) ×1 IMPLANT
CANNULA ANT/CHMB 27GA (MISCELLANEOUS) ×4 IMPLANT
DISSECTOR HYDRO NUCLEUS 50X22 (MISCELLANEOUS) ×2 IMPLANT
ERASER HMR WETFIELD 18G (MISCELLANEOUS) ×2 IMPLANT
GLOVE PI ULTRA LF STRL 7.5 (GLOVE) ×1 IMPLANT
GLOVE PI ULTRA NON LATEX 7.5 (GLOVE) ×1
GLOVE SURG SYN 8.5  E (GLOVE) ×1
GLOVE SURG SYN 8.5 E (GLOVE) ×1 IMPLANT
GOWN STRL REUS W/ TWL LRG LVL3 (GOWN DISPOSABLE) ×2 IMPLANT
GOWN STRL REUS W/TWL LRG LVL3 (GOWN DISPOSABLE) ×4
LENS IOL TECNIS EYHANCE 25.5 (Intraocular Lens) ×2 IMPLANT
MARKER SKIN DUAL TIP RULER LAB (MISCELLANEOUS) ×2 IMPLANT
NDL RETROBULBAR 25GX1.5 STRL (NEEDLE) ×2 IMPLANT
PACK DR. KING ARMS (PACKS) ×2 IMPLANT
PACK EYE AFTER SURG (MISCELLANEOUS) ×2 IMPLANT
PACK OPTHALMIC (MISCELLANEOUS) ×2 IMPLANT
PROTECTOR LASIK FLAP (MISCELLANEOUS) ×2 IMPLANT
RING MALYGIN (MISCELLANEOUS) ×2 IMPLANT
SPONGE SURG I SPEAR (MISCELLANEOUS) ×2 IMPLANT
SUT ETHILON 10-0 CS-B-6CS-B-6 (SUTURE) ×2
SUT ETHILON 9-0 (SUTURE) ×2 IMPLANT
SUT SILK 5-0 (SUTURE) ×2 IMPLANT
SUT VICRYL 7 0 TG140 8 (SUTURE) ×2 IMPLANT
SUTURE EHLN 10-0 CS-B-6CS-B-6 (SUTURE) ×1 IMPLANT
SYR 10ML LL (SYRINGE) ×2 IMPLANT
SYR 3ML LL SCALE MARK (SYRINGE) ×2 IMPLANT
SYR TB 1ML LUER SLIP (SYRINGE) ×2 IMPLANT
TUTOPLAST SCIERA 0.5X1.0 (Tissue) ×2 IMPLANT
VALVE GLAUCOMA AHMED (Prosthesis & Implant Heart) ×2 IMPLANT
WATER STERILE IRR 250ML POUR (IV SOLUTION) ×2 IMPLANT
WIPE NON LINTING 3.25X3.25 (MISCELLANEOUS) ×2 IMPLANT

## 2021-02-09 NOTE — Op Note (Signed)
OPERATIVE NOTE  William Holden [id number] 02/09/2021   PREOPERATIVE DIAGNOSIS:   1.  Severe primary open angle glaucoma, right eye. H40.1113 2.  Nuclear sclerotic cataract, right eye. H25.11   POSTOPERATIVE DIAGNOSIS:     same   PROCEDURE:   1.  Ahmed tube shunt and placement of tutoplast CPT 66180 2.  Complex phacoemulsification of cataract and placement of intraocular lens, requiring use of trypan blue and a malyugin ring. 66982  IMPLANT: Implant Name Type Inv. Item Serial No. Manufacturer Lot No. LRB No. Used Action  LENS IOL TECNIS EYHANCE 25.5 - [id number] Intraocular Lens LENS IOL TECNIS EYHANCE 25.5 [id number] [id number]   Right 1 Implanted  TUTOPLAST SCIERA 0.5X1.0 - [id number] Tissue TUTOPLAST SCIERA 0.5X1.0 [id number] RIT [id number] Right 1 Implanted  VALVE GLAUCOMA AHMED - [id number] Prosthesis & Implant Heart VALVE GLAUCOMA AHMED [id number] [employer] [id number] Right 1 Implanted       SURGEON:  Benay Pillow, MD, MPH   ANESTHESIA:  MAC and retrobulbar block with 50%/50% mix of 0.75% bupivicaine and 2% preservative free lidocaine with a small amount of vitrase.  ESTIMATED BLOOD LOSS: <1 mL   COMPLICATIONS:  None.  ANESTHESIOLOGIST: Anesthesiologist: Page, Adele Barthel, MD CRNA: Jeannene Patella, CRNA; Silvana Newness, CRNA  ULTRASOUND TIME: 1 minutes 00 seconds.  CDE 10.48     DESCRIPTION OF PROCEDURE: The patient was identified in the holding room and transported to the operating room and placed in the supine position. A time out was called identifiying the right eye as the operative eye and a retrobulbar block was administered in the standard fashion. It was prepped and draped in the usual sterile ophthalmic fashion.  A 1.0 millimeter clear-corneal paracentesis was made at the 10:30 position. 0.5 ml of preservative-free 1% lidocaine with epinephrine was injected into the anterior chamber.  The red reflex was poor, so trypan blue was used in the standard  fashion. The pupil was small, so a malyugin ring was placed without difficulty.  The anterior chamber was filled with Healon 5 viscoelastic.   A 2.4 millimeter keratome was used to make a near-clear corneal incision at the 8:00 position.  A curvilinear capsulorrhexis was made with a cystotome and capsulorrhexis forceps.  Balanced salt solution was used to hydrodissect and hydrodelineate the nucleus.   Phacoemulsification was then used in stop and chop fashion to remove the lens nucleus and epinucleus.  The remaining cortex was then removed using the irrigation and aspiration handpiece. Healon was then placed into the capsular bag to distend it for lens placement.  A lens was then injected into the capsular bag.   The Malyugin ring was removed without difficulty. The remaining viscoelastic was aspirated.   Wounds were hydrated with balanced salt solution.  The anterior chamber was inflated to a physiologic pressure with balanced salt solution.   A 10-0 nylon suture was placed through the clear corneal incision and the knot buried.    Attention was turned to the Ahmed tube procedure  A marking pen was used to mark the 12:00 and 9:00 positions at the limbus.  A 5-0 silk traction suture was placed through the cornea and the eye rotated to expose the superotemporal quadrant. A corneal sponge was placed to keep the cornea moist.  The conj was dissected from the limbus and posteriorly. Radial relaxing incisions were made in the conjunctiva.  A 18 gauge pencil cautery was used to obtain hemostasis for the exposed conjunctiva.  The calipers were set to 8.5  mm and the sclera was marked posterior to the limbus. The Ahmed tube shunt was brought onto the field and was primed with BSS on a 27 gauge cannula. Visualization of the fluid from the plate was confirmed. The plate was secured with two 9-0 nylon on a spatulated needle with partial thickness bites throught the sclera at the premarked sites and  the sutures were buried in the eyelets of the plate.  The tube was trimmed to the appropriate length with an anterior bevel with westcott scissors.  A 22 gauge needle was used to create a short scleral tunnel and enter into the anterior chamber parallel to the iris. The tube was inserted with tube inserter forceps and was in good position, not contacting the cornea.  The tutoplast was brought onto the field and trimmed and secured using 7-0 vicryl sutures.  The conjunctiva was reapproximated to the limbus with an 7-0 vicyrl suture running to the reapproximate the relaxing incisions at both 3:00 and 12:00 positions.   Intracameral vigamox 0.1 mL undiluted was injected into the eye and a drop placed onto the ocular surface.  The lid speculum was removed, the face was cleaned with a wet and dry. Maxitrol ointment, a patch and shield were placed over the left eye.   The patient was taken to the recovery room in stable condition without complications of anesthesia or surgery.  The patient is to leave the patch and shield in place and we will see them in the clinic for followup tomorrow.  Benay Pillow 02/09/2021, 9:35 AM

## 2021-02-09 NOTE — Anesthesia Procedure Notes (Signed)
Procedure Name: MAC Date/Time: 02/09/2021 8:10 AM Performed by: Silvana Newness, CRNA Pre-anesthesia Checklist: Patient identified, Emergency Drugs available, Suction available, Patient being monitored and Timeout performed Patient Re-evaluated:Patient Re-evaluated prior to induction Oxygen Delivery Method: Nasal cannula Placement Confirmation: positive ETCO2

## 2021-02-09 NOTE — Anesthesia Preprocedure Evaluation (Signed)
Anesthesia Evaluation  Patient identified by MRN, date of birth, ID band Patient awake    History of Anesthesia Complications Negative for: history of anesthetic complications  Airway Mallampati: III  TM Distance: >3 FB Neck ROM: Full    Dental no notable dental hx.    Pulmonary former smoker,    Pulmonary exam normal        Cardiovascular Exercise Tolerance: Good hypertension, negative cardio ROS Normal cardiovascular exam     Neuro/Psych negative neurological ROS     GI/Hepatic Hx of hepatitis/pancreatitis 2016 2/2 obstructing stone   Endo/Other  negative endocrine ROS  Renal/GU Renal disease (stage III CKD)     Musculoskeletal   Abdominal   Peds  Hematology negative hematology ROS (+)   Anesthesia Other Findings   Reproductive/Obstetrics                             Anesthesia Physical Anesthesia Plan  ASA: III  Anesthesia Plan: MAC   Post-op Pain Management:    Induction: Intravenous  PONV Risk Score and Plan: 1 and TIVA, Midazolam and Treatment may vary due to age or medical condition  Airway Management Planned: Nasal Cannula and Natural Airway  Additional Equipment: None  Intra-op Plan:   Post-operative Plan:   Informed Consent: I have reviewed the patients History and Physical, chart, labs and discussed the procedure including the risks, benefits and alternatives for the proposed anesthesia with the patient or authorized representative who has indicated his/her understanding and acceptance.       Plan Discussed with: CRNA  Anesthesia Plan Comments:         Anesthesia Quick Evaluation

## 2021-02-09 NOTE — H&P (Signed)
[facility]   Primary Care Physician:  Fayrene Helper, NP Ophthalmologist: Dr. Willey Blade  Pre-Procedure History & Physical: HPI:  William Holden is a 86 y.o. male here for cataract surgery (combined with glaucoma surgery).   Past Medical History:  Diagnosis Date  . Bowel obstruction (HCC)   . CKD (chronic kidney disease), stage III [facility])     Past Surgical History:  Procedure Laterality Date  . CHOLECYSTECTOMY    . ENDOSCOPIC RETROGRADE CHOLANGIOPANCREATOGRAPHY (ERCP) WITH PROPOFOL N/A 10/12/2015   Procedure: ENDOSCOPIC RETROGRADE CHOLANGIOPANCREATOGRAPHY (ERCP) WITH PROPOFOL;  Surgeon: Midge Minium, MD;  Location: [facility] ENDOSCOPY;  Service: Endoscopy;  Laterality: N/A;  . PROSTATE SURGERY      Prior to Admission medications   Not on File    Allergies as of 12/25/2020  . (No Known Allergies)    Family History  Problem Relation Age of Onset  . Hypertension Other   . Prostate cancer Father     Social History   Socioeconomic History  . Marital status: Single    Spouse name: Not on file  . Number of children: Not on file  . Years of education: Not on file  . Highest education level: Not on file  Occupational History  . Not on file  Tobacco Use  . Smoking status: Former Games developer  . Smokeless tobacco: Never Used  Vaping Use  . Vaping Use: Never used  Substance and Sexual Activity  . Alcohol use: No    Alcohol/week: 0.0 standard drinks  . Drug use: No  . Sexual activity: Not on file  Other Topics Concern  . Not on file  Social History Narrative   ** Merged History Encounter **       Social Determinants of Health   Financial Resource Strain: Not on file  Food Insecurity: Not on file  Transportation Needs: Not on file  Physical Activity: Not on file  Stress: Not on file  Social Connections: Not on file  Intimate Partner Violence: Not on file    Review of Systems: See HPI, otherwise negative ROS  Physical Exam: BP (!) 200/95   Pulse  (!) 58   Temp 97.7 F (36.5 C) (Temporal)   Resp 18   Ht 5\' 3"  (1.6 m)   Wt 59.9 kg   SpO2 100%   BMI 23.39 kg/m  General:   Alert,  pleasant and cooperative in NAD Head:  Normocephalic and atraumatic. Respiratory:  Normal work of breathing. Cardiovascular:  RRR  Impression/Plan: Tal is here for cataract/glaucoma surgery.  Risks, benefits, limitations, and alternatives regarding cataract surgery have been reviewed with the patient.  Questions have been answered.  All parties agreeable.   Alethia Berthold, MD  02/09/2021, 7:56 AM

## 2021-02-09 NOTE — Anesthesia Postprocedure Evaluation (Signed)
Anesthesia Post Note  Patient: Chemical engineer  Procedure(s) Performed: CATARACT EXTRACTION PHACO AND INTRAOCULAR LENS PLACEMENT (IOC) RIGHT AHMED TUBE SHUNT W/ TUTOPLAST 10.48 01:00.9 (Right Eye)     Patient location during evaluation: PACU Anesthesia Type: MAC Level of consciousness: awake and alert Pain management: pain level controlled Vital Signs Assessment: post-procedure vital signs reviewed and stable Respiratory status: spontaneous breathing Cardiovascular status: blood pressure returned to baseline Postop Assessment: no apparent nausea or vomiting, adequate PO intake and no headache Anesthetic complications: no   No complications documented.  Adele Barthel Ivey Cina

## 2021-02-09 NOTE — Transfer of Care (Signed)
Immediate Anesthesia Transfer of Care Note  Patient: William Holden  Procedure(s) Performed: CATARACT EXTRACTION PHACO AND INTRAOCULAR LENS PLACEMENT (IOC) RIGHT AHMED TUBE SHUNT W/ TUTOPLAST 10.48 01:00.9 (Right Eye)  Patient Location: PACU  Anesthesia Type: MAC  Level of Consciousness: awake, alert  and patient cooperative  Airway and Oxygen Therapy: Patient Spontanous Breathing and Patient connected to supplemental oxygen  Post-op Assessment: Post-op Vital signs reviewed, Patient's Cardiovascular Status Stable, Respiratory Function Stable, Patent Airway and No signs of Nausea or vomiting  Post-op Vital Signs: Reviewed and stable  Complications: No complications documented.

## 2021-02-10 ENCOUNTER — Encounter: Payer: Self-pay | Admitting: Ophthalmology

## 2021-02-13 ENCOUNTER — Encounter: Payer: Self-pay | Admitting: Emergency Medicine

## 2021-02-13 ENCOUNTER — Emergency Department: Payer: Medicare Other

## 2021-02-13 ENCOUNTER — Other Ambulatory Visit: Payer: Self-pay

## 2021-02-13 ENCOUNTER — Emergency Department
Admission: EM | Admit: 2021-02-13 | Discharge: 2021-02-13 | Disposition: A | Discharge status: Home / Self Care | Payer: Medicare Other | Attending: Emergency Medicine | Admitting: Emergency Medicine

## 2021-02-13 DIAGNOSIS — N183 Chronic kidney disease, stage 3 unspecified: Secondary | ICD-10-CM | POA: Diagnosis not present

## 2021-02-13 DIAGNOSIS — Z87891 Personal history of nicotine dependence: Secondary | ICD-10-CM | POA: Insufficient documentation

## 2021-02-13 DIAGNOSIS — Z20822 Contact with and (suspected) exposure to covid-19: Secondary | ICD-10-CM | POA: Diagnosis not present

## 2021-02-13 DIAGNOSIS — I10 Essential (primary) hypertension: Secondary | ICD-10-CM | POA: Diagnosis not present

## 2021-02-13 DIAGNOSIS — R519 Headache, unspecified: Secondary | ICD-10-CM | POA: Insufficient documentation

## 2021-02-13 LAB — COMPREHENSIVE METABOLIC PANEL
ALT: 12 U/L (ref 0–44)
AST: 24 U/L (ref 15–41)
Albumin: 4.2 g/dL (ref 3.5–5.0)
Alkaline Phosphatase: 104 U/L (ref 38–126)
Anion gap: 8 (ref 5–15)
BUN: 32 mg/dL — ABNORMAL HIGH (ref 8–23)
CO2: 22 mmol/L (ref 22–32)
Calcium: 9.9 mg/dL (ref 8.9–10.3)
Chloride: 109 mmol/L (ref 98–111)
Creatinine, Ser: 2.09 mg/dL — ABNORMAL HIGH (ref 0.61–1.24)
GFR, Estimated: 31 mL/min — ABNORMAL LOW (ref >60)
Glucose, Bld: 139 mg/dL — ABNORMAL HIGH (ref 70–99)
Potassium: 4.5 mmol/L (ref 3.5–5.1)
Sodium: 139 mmol/L (ref 135–145)
Total Bilirubin: 0.9 mg/dL (ref 0.3–1.2)
Total Protein: 8 g/dL (ref 6.5–8.1)

## 2021-02-13 LAB — RESP PANEL BY RT-PCR (FLU A&B, COVID) ARPGX2
Influenza A by PCR: NEGATIVE
Influenza B by PCR: NEGATIVE
SARS Coronavirus 2 by RT PCR: NEGATIVE

## 2021-02-13 LAB — CBC WITH DIFFERENTIAL/PLATELET
Abs Immature Granulocytes: 0.03 10*3/uL (ref 0.00–0.07)
Basophils Absolute: 0 10*3/uL (ref 0.0–0.1)
Basophils Relative: 0 %
Eosinophils Absolute: 0 10*3/uL (ref 0.0–0.5)
Eosinophils Relative: 0 %
HCT: 44.7 % (ref 39.0–52.0)
Hemoglobin: 16.1 g/dL (ref 13.0–17.0)
Immature Granulocytes: 0 %
Lymphocytes Relative: 15 %
Lymphs Abs: 1.2 10*3/uL (ref 0.7–4.0)
MCH: 32.1 pg (ref 26.0–34.0)
MCHC: 36 g/dL (ref 30.0–36.0)
MCV: 89.2 fL (ref 80.0–100.0)
Monocytes Absolute: 0.3 10*3/uL (ref 0.1–1.0)
Monocytes Relative: 4 %
Neutro Abs: 6.2 10*3/uL (ref 1.7–7.7)
Neutrophils Relative %: 81 %
Platelets: 215 10*3/uL (ref 150–400)
RBC: 5.01 MIL/uL (ref 4.22–5.81)
RDW: 13.1 % (ref 11.5–15.5)
WBC: 7.8 10*3/uL (ref 4.0–10.5)
nRBC: 0 % (ref 0.0–0.2)

## 2021-02-13 LAB — C-REACTIVE PROTEIN: CRP: 0.5 mg/dL (ref <1.0)

## 2021-02-13 LAB — SEDIMENTATION RATE: Sed Rate: 16 mm/hr (ref 0–20)

## 2021-02-13 LAB — TROPONIN I (HIGH SENSITIVITY)
Troponin I (High Sensitivity): 13 ng/L (ref <18)
Troponin I (High Sensitivity): 15 ng/L (ref <18)

## 2021-02-13 MED ORDER — METOCLOPRAMIDE HCL 5 MG/ML IJ SOLN
5.0000 mg | Freq: Once | INTRAMUSCULAR | Status: AC
  Administered 2021-02-13: 5 mg via INTRAVENOUS
  Filled 2021-02-13: qty 2

## 2021-02-13 MED ORDER — LISINOPRIL 5 MG PO TABS: 5.0000 mg | ORAL_TABLET | Freq: Every day | ORAL | 0 refills | Status: DC

## 2021-02-13 NOTE — ED Provider Notes (Signed)
[facility] Emergency Department Provider Note   ____________________________________________   Event Date/Time   First MD Initiated Contact with Patient 02/13/21 [phone]     (approximate)  I have reviewed the triage vital signs and the nursing notes.   HISTORY  Chief Complaint Headache  Son declines interpreter  HPI William Holden is a 86 y.o. male brought to the ED from home by his son with a chief complaint of headache.  Patient had right eye glaucoma surgery 5 days ago.  Son states recovery without complications.  Developed gradual onset, right-sided headache yesterday without vision changes, nausea/vomiting or neck pain.  Took 2 Tylenol proximately 1.5 hours prior to arrival without relief of symptoms.  Denies recent fever, chills, chest pain, shortness of breath, abdominal pain.  Patient is vaccinated and boosted against COVID-19.  Denies history of frequent headaches or migraines.  Denies altered mentation, slurred speech, facial droop, extremity weakness/numbness/tingling.     Past Medical History:  Diagnosis Date  . Bowel obstruction (HCC)   . CKD (chronic kidney disease), stage III [facility])     Patient Active Problem List   Diagnosis Date Noted  . Choledocholithiasis 10/13/2015  . Duodenal ulcer 10/13/2015  . Biliary colic   . Disease of biliary tract   . Pancreatitis 10/11/2015  . Abnormal [place] (ultrasound) of abdomen   . Pancreatitis, acute   . Elevated liver enzymes   . Epigastric pain     Past Surgical History:  Procedure Laterality Date  . CATARACT EXTRACTION W/PHACO Right 02/09/2021   Procedure: CATARACT EXTRACTION PHACO AND INTRAOCULAR LENS PLACEMENT (IOC) RIGHT AHMED TUBE SHUNT W/ TUTOPLAST 10.48 01:00.9;  Surgeon: Nevada Crane, MD;  Location: [facility];  Service: Ophthalmology;  Laterality: Right;  60 MINUTES PER JODI  . CHOLECYSTECTOMY    . ENDOSCOPIC RETROGRADE CHOLANGIOPANCREATOGRAPHY (ERCP) WITH PROPOFOL N/A  10/12/2015   Procedure: ENDOSCOPIC RETROGRADE CHOLANGIOPANCREATOGRAPHY (ERCP) WITH PROPOFOL;  Surgeon: Midge Minium, MD;  Location: [facility] ENDOSCOPY;  Service: Endoscopy;  Laterality: N/A;  . PROSTATE SURGERY      Prior to Admission medications   Not on File    Allergies Patient has no known allergies.  Family History  Problem Relation Age of Onset  . Hypertension Other   . Prostate cancer Father     Social History Social History   Tobacco Use  . Smoking status: Former Games developer  . Smokeless tobacco: Never Used  Vaping Use  . Vaping Use: Never used  Substance Use Topics  . Alcohol use: No    Alcohol/week: 0.0 standard drinks  . Drug use: No    Review of Systems  Constitutional: No fever/chills Eyes: No visual changes. ENT: No sore throat. Cardiovascular: Denies chest pain. Respiratory: Denies shortness of breath. Gastrointestinal: No abdominal pain.  No nausea, no vomiting.  No diarrhea.  No constipation. Genitourinary: Negative for dysuria. Musculoskeletal: Negative for back pain. Skin: Negative for rash. Neurological: Positive for headache. Negative for focal weakness or numbness.   ____________________________________________   PHYSICAL EXAM:  VITAL SIGNS: ED Triage Vitals  Enc Vitals Group     BP 02/13/21 0525 (!) 194/91     Pulse Rate 02/13/21 0525 63     Resp 02/13/21 0525 18     Temp 02/13/21 0525 97.8 F (36.6 C)     Temp Source 02/13/21 0525 Oral     SpO2 02/13/21 0525 95 %     Weight 02/13/21 0523 134 lb (60.8 kg)     Height 02/13/21  0017 5\' 3"  (1.6 m)     Head Circumference --      Peak Flow --      Pain Score 02/13/21 0523 10     Pain Loc --      Pain Edu? --      Excl. in GC? --     Constitutional: Alert and oriented.  Elderly appearing and in no acute distress. Eyes: Conjunctivae are normal. PERRL. EOMI. Head: Atraumatic.  Right temple tender to palpation. Nose: No congestion/rhinnorhea. Mouth/Throat: Mucous membranes are moist.    Neck: No stridor.  No carotid bruits.  Supple neck without meningismus. Cardiovascular: Normal rate, regular rhythm. Grossly normal heart sounds.  Good peripheral circulation. Respiratory: Normal respiratory effort.  No retractions. Lungs CTAB. Gastrointestinal: Soft and nontender. No distention. No abdominal bruits. No CVA tenderness. Musculoskeletal: No lower extremity tenderness nor edema.  No joint effusions. Neurologic: Alert and oriented x3.  CN II to XII grossly intact.  Normal speech and language. No gross focal neurologic deficits are appreciated. MAEx4. Skin:  Skin is warm, dry and intact. No rash noted.  No petechiae. Psychiatric: Mood and affect are normal. Speech and behavior are normal.  ____________________________________________   LABS (all labs ordered are listed, but only abnormal results are displayed)  Labs Reviewed  RESP PANEL BY RT-PCR (FLU A&B, COVID) ARPGX2  CBC WITH DIFFERENTIAL/PLATELET  COMPREHENSIVE METABOLIC PANEL  SEDIMENTATION RATE  C-REACTIVE PROTEIN  TROPONIN I (HIGH SENSITIVITY)   ____________________________________________  EKG  ED ECG REPORT I, Tocara Mennen J, the attending physician, personally viewed and interpreted this ECG.   Date: 02/13/2021  EKG Time: 0608  Rate: 61  Rhythm: normal EKG, normal sinus rhythm  Axis: Normal  Intervals:none  ST&T Change: Nonspecific  ____________________________________________  RADIOLOGY I, Danaisha Celli J, personally viewed and evaluated these images (plain radiographs) as part of my medical decision making, as well as reviewing the written report by the radiologist.  ED MD interpretation:  Pending  Official radiology report(s): No results found.  ____________________________________________   PROCEDURES  Procedure(s) performed (including Critical Care):  .1-3 Lead EKG Interpretation Performed by: [id number], MD Authorized by: Irean Hong, MD     Interpretation: normal     ECG rate:   61   ECG rate assessment: normal     Rhythm: sinus rhythm     Ectopy: none     Conduction: normal   Comments:     Placed on cardiac monitor to evaluate for arrhythmias     ____________________________________________   INITIAL IMPRESSION / ASSESSMENT AND PLAN / ED COURSE  As part of my medical decision making, I reviewed the following data within the electronic MEDICAL RECORD NUMBER History obtained from family, Nursing notes reviewed and incorporated, Labs reviewed, Old chart reviewed and Notes from prior ED visits     86 year old male presenting with right-sided headache; right glaucoma surgery 4 days ago. Differential diagnosis includes, but is not limited to, intracranial hemorrhage, meningitis/encephalitis, previous head trauma, cavernous venous thrombosis, tension headache, temporal arteritis, migraine or migraine equivalent, idiopathic intracranial hypertension, and non-specific headache.  We will obtain lab work including sed rate as patient is tender to the touch on his right temple.  Obtain CT head, repeat Covid swab.  Son states patient does not take antihypertensives; will continue to monitor blood pressure as it may be elevated from pain.  Administer IV Reglan for headache and reassess.  Clinical Course as of 02/13/21 0659  Fri Feb 13, 2021  Feb 15, 2021 Care transferred to  Dr. Vicente Males pending lab results and reassessment. Anticipate discharge home if labs are unremarkable and patient's clinical status and blood pressure improved. [JS]    Clinical Course User Index [JS] Irean Hong, MD     ____________________________________________   FINAL CLINICAL IMPRESSION(S) / ED DIAGNOSES  Final diagnoses:  Acute nonintractable headache, unspecified headache type     ED Discharge Orders    None      *Please note:  William Holden was evaluated in Emergency Department on 02/13/2021 for the symptoms described in the history of present illness. He was evaluated in the context  of the global COVID-19 pandemic, which necessitated consideration that the patient might be at risk for infection with the SARS-CoV-2 virus that causes COVID-19. Institutional protocols and algorithms that pertain to the evaluation of patients at risk for COVID-19 are in a state of rapid change based on information released by regulatory bodies including the CDC and federal and state organizations. These policies and algorithms were followed during the patient's care in the ED.  Some ED evaluations and interventions may be delayed as a result of limited staffing during and the pandemic.*   Note:  This document was prepared using Dragon voice recognition software and may include unintentional dictation errors.   Irean Hong, MD 02/13/21 0700

## 2021-02-13 NOTE — ED Triage Notes (Addendum)
Patient ambulatory to triage with steady gait, without difficulty or distress noted, accomp by son; Glaucoma surgery on rt eye Monday; rt sided HA since yesterday with no accomp symptoms; denies hx of same; tylenol taken PTA without relief; V x 1

## 2021-02-13 NOTE — ED Notes (Signed)
Documentation completed by Olivia Prillaman SRN reviewed and approved by this RN  

## 2021-02-17 DIAGNOSIS — H2512 Age-related nuclear cataract, left eye: Secondary | ICD-10-CM | POA: Diagnosis not present

## 2021-02-19 DIAGNOSIS — H44411 Flat anterior chamber hypotony of right eye: Secondary | ICD-10-CM | POA: Diagnosis not present

## 2021-03-16 ENCOUNTER — Ambulatory Visit: Admission: RE | Admit: 2021-03-16 | Payer: Medicare Other | Source: Home / Self Care | Admitting: Ophthalmology

## 2021-03-16 ENCOUNTER — Encounter: Admission: RE | Payer: Self-pay | Source: Home / Self Care

## 2021-03-16 SURGERY — PHACOEMULSIFICATION, CATARACT, WITH IOL INSERTION
Anesthesia: Topical | Laterality: Left

## 2022-07-20 DIAGNOSIS — N189 Chronic kidney disease, unspecified: Secondary | ICD-10-CM | POA: Diagnosis not present

## 2022-07-20 DIAGNOSIS — E663 Overweight: Secondary | ICD-10-CM | POA: Diagnosis not present

## 2022-07-20 DIAGNOSIS — H9113 Presbycusis, bilateral: Secondary | ICD-10-CM | POA: Diagnosis not present

## 2022-07-20 DIAGNOSIS — R5383 Other fatigue: Secondary | ICD-10-CM | POA: Diagnosis not present

## 2022-07-20 DIAGNOSIS — H5461 Unqualified visual loss, right eye, normal vision left eye: Secondary | ICD-10-CM | POA: Diagnosis not present

## 2022-07-27 DIAGNOSIS — I129 Hypertensive chronic kidney disease with stage 1 through stage 4 chronic kidney disease, or unspecified chronic kidney disease: Secondary | ICD-10-CM | POA: Diagnosis not present

## 2022-07-27 DIAGNOSIS — N1832 Chronic kidney disease, stage 3b: Secondary | ICD-10-CM | POA: Diagnosis not present

## 2022-07-27 DIAGNOSIS — R809 Proteinuria, unspecified: Secondary | ICD-10-CM | POA: Diagnosis not present

## 2022-07-30 DIAGNOSIS — R946 Abnormal results of thyroid function studies: Secondary | ICD-10-CM | POA: Diagnosis not present

## 2022-08-23 DIAGNOSIS — Z23 Encounter for immunization: Secondary | ICD-10-CM | POA: Diagnosis not present

## 2022-10-28 DIAGNOSIS — H401133 Primary open-angle glaucoma, bilateral, severe stage: Secondary | ICD-10-CM | POA: Diagnosis not present

## 2022-10-28 DIAGNOSIS — H34812 Central retinal vein occlusion, left eye, with macular edema: Secondary | ICD-10-CM | POA: Diagnosis not present

## 2022-12-01 DIAGNOSIS — H34812 Central retinal vein occlusion, left eye, with macular edema: Secondary | ICD-10-CM | POA: Diagnosis not present

## 2023-01-19 DIAGNOSIS — H34812 Central retinal vein occlusion, left eye, with macular edema: Secondary | ICD-10-CM | POA: Diagnosis not present

## 2023-03-02 DIAGNOSIS — H34812 Central retinal vein occlusion, left eye, with macular edema: Secondary | ICD-10-CM | POA: Diagnosis not present

## 2023-03-07 ENCOUNTER — Encounter: Payer: Self-pay | Admitting: Ophthalmology

## 2023-03-07 DIAGNOSIS — H2512 Age-related nuclear cataract, left eye: Secondary | ICD-10-CM | POA: Diagnosis not present

## 2023-03-09 NOTE — Discharge Instructions (Signed)

## 2023-03-14 ENCOUNTER — Other Ambulatory Visit: Payer: Self-pay

## 2023-03-14 ENCOUNTER — Encounter: Payer: Self-pay | Admitting: Ophthalmology

## 2023-03-14 ENCOUNTER — Ambulatory Visit
Admission: RE | Admit: 2023-03-14 | Discharge: 2023-03-14 | Disposition: A | Discharge status: Home / Self Care | Payer: Medicare Other | Attending: Ophthalmology | Admitting: Ophthalmology

## 2023-03-14 ENCOUNTER — Ambulatory Visit: Payer: Medicare Other | Admitting: Anesthesiology

## 2023-03-14 ENCOUNTER — Encounter
Admission: RE | Disposition: A | Discharge status: Home / Self Care | Payer: Self-pay | Source: Home / Self Care | Attending: Ophthalmology

## 2023-03-14 DIAGNOSIS — Z87891 Personal history of nicotine dependence: Secondary | ICD-10-CM | POA: Insufficient documentation

## 2023-03-14 DIAGNOSIS — H409 Unspecified glaucoma: Secondary | ICD-10-CM | POA: Insufficient documentation

## 2023-03-14 DIAGNOSIS — H2512 Age-related nuclear cataract, left eye: Secondary | ICD-10-CM | POA: Diagnosis not present

## 2023-03-14 DIAGNOSIS — N183 Chronic kidney disease, stage 3 unspecified: Secondary | ICD-10-CM | POA: Insufficient documentation

## 2023-03-14 HISTORY — DX: Unspecified glaucoma: H40.9

## 2023-03-14 HISTORY — PX: CATARACT EXTRACTION W/PHACO: SHX586

## 2023-03-14 SURGERY — PHACOEMULSIFICATION, CATARACT, WITH IOL INSERTION
Anesthesia: Monitor Anesthesia Care | Site: Eye | Laterality: Left

## 2023-03-14 MED ORDER — TETRACAINE HCL 0.5 % OP SOLN: 1.0000 [drp] | OPHTHALMIC | Status: DC | PRN

## 2023-03-14 MED ORDER — ARMC OPHTHALMIC DILATING DROPS
1.0000 | OPHTHALMIC | Status: DC | PRN
  Administered 2023-03-14 (×3): 1 via OPHTHALMIC

## 2023-03-14 MED ORDER — SIGHTPATH DOSE#1 BSS IO SOLN
INTRAOCULAR | Status: DC | PRN
  Administered 2023-03-14: 111 mL via OPHTHALMIC

## 2023-03-14 MED ORDER — SIGHTPATH DOSE#1 NA HYALUR & NA CHOND-NA HYALUR IO KIT
PACK | INTRAOCULAR | Status: DC | PRN
  Administered 2023-03-14: 1 via OPHTHALMIC

## 2023-03-14 MED ORDER — TETRACAINE HCL 0.5 % OP SOLN
1.0000 [drp] | OPHTHALMIC | Status: DC | PRN
  Administered 2023-03-14 (×3): 1 [drp] via OPHTHALMIC

## 2023-03-14 MED ORDER — ARMC OPHTHALMIC DILATING DROPS
1.0000 | OPHTHALMIC | Status: DC | PRN
Start: 2023-03-14 — End: 2023-03-14

## 2023-03-14 MED ORDER — LIDOCAINE HCL (PF) 2 % IJ SOLN
INTRAOCULAR | Status: DC | PRN
  Administered 2023-03-14: 1 mL via INTRAOCULAR

## 2023-03-14 MED ORDER — MIDAZOLAM HCL 2 MG/2ML IJ SOLN
INTRAMUSCULAR | Status: DC | PRN
  Administered 2023-03-14: .5 mg via INTRAVENOUS

## 2023-03-14 MED ORDER — SIGHTPATH DOSE#1 BSS IO SOLN
INTRAOCULAR | Status: DC | PRN
  Administered 2023-03-14: 15 mL

## 2023-03-14 MED ORDER — LACTATED RINGERS IV SOLN: INTRAVENOUS | Status: DC

## 2023-03-14 MED ORDER — FENTANYL CITRATE (PF) 100 MCG/2ML IJ SOLN
INTRAMUSCULAR | Status: DC | PRN
  Administered 2023-03-14: 25 ug via INTRAVENOUS

## 2023-03-14 MED ORDER — BRIMONIDINE TARTRATE-TIMOLOL 0.2-0.5 % OP SOLN
OPHTHALMIC | Status: DC | PRN
  Administered 2023-03-14: 1 [drp] via OPHTHALMIC

## 2023-03-14 MED ORDER — MOXIFLOXACIN HCL 0.5 % OP SOLN
OPHTHALMIC | Status: DC | PRN
  Administered 2023-03-14: .2 mL via OPHTHALMIC

## 2023-03-14 SURGICAL SUPPLY — 18 items
CANNULA ANT/CHMB 27G (MISCELLANEOUS) IMPLANT
CANNULA ANT/CHMB 27GA (MISCELLANEOUS) IMPLANT
CATARACT SUITE SIGHTPATH (MISCELLANEOUS) ×1 IMPLANT
DISSECTOR HYDRO NUCLEUS 50X22 (MISCELLANEOUS) ×1 IMPLANT
FEE CATARACT SUITE SIGHTPATH (MISCELLANEOUS) ×1 IMPLANT
GLOVE SURG GAMMEX PI TX LF 7.5 (GLOVE) ×1 IMPLANT
GLOVE SURG SYN 8.5  E (GLOVE) ×1
GLOVE SURG SYN 8.5 E (GLOVE) ×1 IMPLANT
GLOVE SURG SYN 8.5 PF PI (GLOVE) ×1 IMPLANT
LENS IOL TECNIS EYHANCE 24.5 (Intraocular Lens) IMPLANT
NDL FILTER BLUNT 18X1 1/2 (NEEDLE) ×1 IMPLANT
NEEDLE FILTER BLUNT 18X1 1/2 (NEEDLE) ×1 IMPLANT
PACK VIT ANT 23G (MISCELLANEOUS) IMPLANT
RING MALYGIN (MISCELLANEOUS) IMPLANT
SUT ETHILON 10-0 CS-B-6CS-B-6 (SUTURE)
SUTURE EHLN 10-0 CS-B-6CS-B-6 (SUTURE) IMPLANT
SYR 3ML LL SCALE MARK (SYRINGE) ×1 IMPLANT
SYR 5ML LL (SYRINGE) ×1 IMPLANT

## 2023-03-14 NOTE — Transfer of Care (Signed)
Immediate Anesthesia Transfer of Care Note  Patient: William Holden  Procedure(s) Performed: CATARACT EXTRACTION PHACO AND INTRAOCULAR LENS PLACEMENT (IOC) LEFT (Left: Eye)  Patient Location: PACU  Anesthesia Type: MAC  Level of Consciousness: awake, alert  and patient cooperative  Airway and Oxygen Therapy: Patient Spontanous Breathing and Patient connected to supplemental oxygen  Post-op Assessment: Post-op Vital signs reviewed, Patient's Cardiovascular Status Stable, Respiratory Function Stable, Patent Airway and No signs of Nausea or vomiting  Post-op Vital Signs: Reviewed and stable  Complications: No notable events documented.

## 2023-03-14 NOTE — Anesthesia Postprocedure Evaluation (Signed)
Anesthesia Post Note  Patient: William Holden  Procedure(s) Performed: CATARACT EXTRACTION PHACO AND INTRAOCULAR LENS PLACEMENT (IOC) LEFT (Left: Eye)  Patient location during evaluation: PACU Anesthesia Type: MAC Level of consciousness: awake and alert Pain management: pain level controlled Vital Signs Assessment: post-procedure vital signs reviewed and stable Respiratory status: spontaneous breathing, nonlabored ventilation, respiratory function stable and patient connected to nasal cannula oxygen Cardiovascular status: stable and blood pressure returned to baseline Postop Assessment: no apparent nausea or vomiting Anesthetic complications: no   No notable events documented.   Last Vitals:  Vitals:   03/14/23 1215 03/14/23 1216  BP: 135/76   Pulse: (!) 57 (!) 56  Resp: 15 19  Temp:    SpO2: 97% 97%    Last Pain:  Vitals:   03/14/23 1215  TempSrc:   PainSc: 0-No pain                 Bhavya Eschete C Marny Smethers

## 2023-03-14 NOTE — Op Note (Signed)
OPERATIVE NOTE  William Holden [id number] 03/14/2023   PREOPERATIVE DIAGNOSIS:  Nuclear sclerotic cataract left eye.  H25.12   POSTOPERATIVE DIAGNOSIS:    Nuclear sclerotic cataract left eye.     PROCEDURE:  Phacoemusification with posterior chamber intraocular lens placement of the left eye   LENS:   Implant Name Type Inv. Item Serial No. Manufacturer Lot No. LRB No. Used Action  LENS IOL TECNIS EYHANCE 24.5 - [id number] Intraocular Lens LENS IOL TECNIS EYHANCE 24.5 [id number] [employer]  Left 1 Implanted      Procedure(s) with comments: CATARACT EXTRACTION PHACO AND INTRAOCULAR LENS PLACEMENT (IOC) LEFT (Left) - 12.51 1:03.6  DIB00 +24.5   ULTRASOUND TIME: 1 minutes 03 seconds.  CDE 12.51   SURGEON:  Willey Blade, MD, MPH   ANESTHESIA:  Topical with tetracaine drops augmented with 1% preservative-free intracameral lidocaine.  ESTIMATED BLOOD LOSS: <1 mL   COMPLICATIONS:  None.   DESCRIPTION OF PROCEDURE:  The patient was identified in the holding room and transported to the operating room and placed in the supine position under the operating microscope.  The left eye was identified as the operative eye and it was prepped and draped in the usual sterile ophthalmic fashion.   A 1.0 millimeter clear-corneal paracentesis was made at the 5:00 position. 0.5 ml of preservative-free 1% lidocaine with epinephrine was injected into the anterior chamber.  The anterior chamber was filled with viscoelastic.  A 2.4 millimeter keratome was used to make a near-clear corneal incision at the 2:00 position.  A curvilinear capsulorrhexis was made with a cystotome and capsulorrhexis forceps.  Balanced salt solution was used to hydrodissect and hydrodelineate the nucleus.   Phacoemulsification was then used in stop and chop fashion to remove the lens nucleus and epinucleus.  The remaining cortex was then removed using the irrigation and aspiration handpiece. Viscoelastic was then placed into  the capsular bag to distend it for lens placement.  A lens was then injected into the capsular bag.  The remaining viscoelastic was aspirated.   Wounds were hydrated with balanced salt solution.  The anterior chamber was inflated to a physiologic pressure with balanced salt solution.  Intracameral vigamox 0.1 mL undiltued was injected into the eye and a drop placed onto the ocular surface.  No wound leaks were noted.  The patient was taken to the recovery room in stable condition without complications of anesthesia or surgery  Willey Blade 03/14/2023, 12:08 PM

## 2023-03-14 NOTE — Anesthesia Preprocedure Evaluation (Addendum)
Anesthesia Evaluation  Patient identified by MRN, date of birth, ID band Patient awake  General Assessment Comment:Patient could not recall his birthday  Reviewed: Allergy & Precautions, H&P , NPO status , Patient's Chart, lab work & pertinent test results  Airway Mallampati: III  TM Distance: >3 FB Neck ROM: Full    Dental no notable dental hx.  Has original teeth:   Pulmonary former smoker   Pulmonary exam normal breath sounds clear to auscultation       Cardiovascular negative cardio ROS Normal cardiovascular exam Rhythm:Regular Rate:Normal     Neuro/Psych negative neurological ROS  negative psych ROS   GI/Hepatic Neg liver ROS, PUD,,,  Endo/Other  negative endocrine ROS    Renal/GU Renal disease  negative genitourinary   Musculoskeletal negative musculoskeletal ROS (+)    Abdominal   Peds negative pediatric ROS (+)  Hematology negative hematology ROS (+)   Anesthesia Other Findings Hx Bowel obstruction (HCC)  CKD (chronic kidney disease), stage III (HCC) Glaucoma     Reproductive/Obstetrics negative OB ROS                             Anesthesia Physical Anesthesia Plan  ASA: 3  Anesthesia Plan: MAC   Post-op Pain Management:    Induction: Intravenous  PONV Risk Score and Plan:   Airway Management Planned: Natural Airway and Nasal Cannula  Additional Equipment:   Intra-op Plan:   Post-operative Plan:   Informed Consent: I have reviewed the patients History and Physical, chart, labs and discussed the procedure including the risks, benefits and alternatives for the proposed anesthesia with the patient or authorized representative who has indicated his/her understanding and acceptance.     Dental Advisory Given  Plan Discussed with: Anesthesiologist, CRNA and Surgeon  Anesthesia Plan Comments: (Patient consented for risks of anesthesia including but not limited to:   - adverse reactions to medications - damage to eyes, teeth, lips or other oral mucosa - nerve damage due to positioning  - sore throat or hoarseness - Damage to heart, brain, nerves, lungs, other parts of body or loss of life  Patient voiced understanding.)       Anesthesia Quick Evaluation

## 2023-03-14 NOTE — H&P (Signed)
[facility]   Primary Care Physician:  Orson Eva, NP Ophthalmologist: Dr. Willey Blade  Pre-Procedure History & Physical: HPI:  William Holden is a 88 y.o. male here for cataract surgery.   Past Medical History:  Diagnosis Date   Bowel obstruction (HCC)    CKD (chronic kidney disease), stage III (HCC)    Glaucoma     Past Surgical History:  Procedure Laterality Date   CATARACT EXTRACTION W/PHACO Right 02/09/2021   Procedure: CATARACT EXTRACTION PHACO AND INTRAOCULAR LENS PLACEMENT (IOC) RIGHT AHMED TUBE SHUNT W/ TUTOPLAST 10.48 01:00.9;  Surgeon: Nevada Crane, MD;  Location: [facility];  Service: Ophthalmology;  Laterality: Right;  60 MINUTES PER JODI   CHOLECYSTECTOMY     ENDOSCOPIC RETROGRADE CHOLANGIOPANCREATOGRAPHY (ERCP) WITH PROPOFOL N/A 10/12/2015   Procedure: ENDOSCOPIC RETROGRADE CHOLANGIOPANCREATOGRAPHY (ERCP) WITH PROPOFOL;  Surgeon: Midge Minium, MD;  Location: [facility] ENDOSCOPY;  Service: Endoscopy;  Laterality: N/A;   PROSTATE SURGERY      Prior to Admission medications   Not on File    Allergies as of 03/04/2023   (No Known Allergies)    Family History  Problem Relation Age of Onset   Hypertension Other    Prostate cancer Father     Social History   Socioeconomic History   Marital status: Single    Spouse name: Not on file   Number of children: Not on file   Years of education: Not on file   Highest education level: Not on file  Occupational History   Not on file  Tobacco Use   Smoking status: Former   Smokeless tobacco: Never  Vaping Use   Vaping Use: Never used  Substance and Sexual Activity   Alcohol use: No    Alcohol/week: 0.0 standard drinks of alcohol   Drug use: No   Sexual activity: Not on file  Other Topics Concern   Not on file  Social History Narrative   ** Merged History Encounter **       Social Determinants of Health   Financial Resource Strain: Not on file  Food Insecurity: Not on file   Transportation Needs: Not on file  Physical Activity: Not on file  Stress: Not on file  Social Connections: Not on file  Intimate Partner Violence: Not on file    Review of Systems: See HPI, otherwise negative ROS  Physical Exam: BP (!) 172/95   Pulse 71   Temp (!) 97.2 F (36.2 C) (Temporal)   Resp 15   Ht 5' 4.02" (1.626 m)   Wt 56.7 kg   SpO2 95%   BMI 21.46 kg/m  General:   Alert, cooperative in NAD Head:  Normocephalic and atraumatic. Respiratory:  Normal work of breathing. Cardiovascular:  RRR  Impression/Plan: Durant Alethia Berthold is here for cataract surgery.  Risks, benefits, limitations, and alternatives regarding cataract surgery have been reviewed with the patient.  Questions have been answered.  All parties agreeable.   Willey Blade, MD  03/14/2023, 11:32 AM

## 2023-03-15 ENCOUNTER — Encounter: Payer: Self-pay | Admitting: Ophthalmology

## 2023-04-27 DIAGNOSIS — H34812 Central retinal vein occlusion, left eye, with macular edema: Secondary | ICD-10-CM | POA: Diagnosis not present

## 2023-07-20 DIAGNOSIS — H34812 Central retinal vein occlusion, left eye, with macular edema: Secondary | ICD-10-CM | POA: Diagnosis not present

## 2023-07-21 ENCOUNTER — Ambulatory Visit: Payer: Medicare Other | Admitting: Cardiology

## 2023-07-21 ENCOUNTER — Encounter: Payer: Self-pay | Admitting: Cardiology

## 2023-07-21 VITALS — BP 125/70 | HR 60 | Ht 66.0 in | Wt 127.8 lb

## 2023-07-21 DIAGNOSIS — Z1329 Encounter for screening for other suspected endocrine disorder: Secondary | ICD-10-CM

## 2023-07-21 DIAGNOSIS — R748 Abnormal levels of other serum enzymes: Secondary | ICD-10-CM

## 2023-07-21 DIAGNOSIS — H278 Other specified disorders of lens: Secondary | ICD-10-CM

## 2023-07-21 DIAGNOSIS — Z13220 Encounter for screening for lipoid disorders: Secondary | ICD-10-CM | POA: Diagnosis not present

## 2023-07-21 DIAGNOSIS — Z131 Encounter for screening for diabetes mellitus: Secondary | ICD-10-CM | POA: Diagnosis not present

## 2023-07-21 NOTE — Progress Notes (Signed)
Established Patient Office Visit  Subjective:  Patient ID: William Holden, male    DOB: 09-19-1935  Age: 88 y.o. MRN: [id number]  Chief Complaint  Patient presents with   Follow-up    1 YR F/U    Patient in office for yearly follow up. Son is interpretor today. Patient reports feeling well. No acute complaints. No recent lab work, no fasting today. Will return for fasting blood work.     No other concerns at this time.   Past Medical History:  Diagnosis Date   Bowel obstruction (HCC)    CKD (chronic kidney disease), stage III (HCC)    Glaucoma     Past Surgical History:  Procedure Laterality Date   CATARACT EXTRACTION W/PHACO Right 02/09/2021   Procedure: CATARACT EXTRACTION PHACO AND INTRAOCULAR LENS PLACEMENT (IOC) RIGHT AHMED TUBE SHUNT W/ TUTOPLAST 10.48 01:00.9;  Surgeon: Nevada Crane, MD;  Location: [facility];  Service: Ophthalmology;  Laterality: Right;  60 MINUTES PER JODI   CATARACT EXTRACTION W/PHACO Left 03/14/2023   Procedure: CATARACT EXTRACTION PHACO AND INTRAOCULAR LENS PLACEMENT (IOC) LEFT;  Surgeon: Nevada Crane, MD;  Location: [facility];  Service: Ophthalmology;  Laterality: Left;  12.51 1:03.6   CHOLECYSTECTOMY     ENDOSCOPIC RETROGRADE CHOLANGIOPANCREATOGRAPHY (ERCP) WITH PROPOFOL N/A 10/12/2015   Procedure: ENDOSCOPIC RETROGRADE CHOLANGIOPANCREATOGRAPHY (ERCP) WITH PROPOFOL;  Surgeon: Midge Minium, MD;  Location: [facility] ENDOSCOPY;  Service: Endoscopy;  Laterality: N/A;   PROSTATE SURGERY      Social History   Socioeconomic History   Marital status: Single    Spouse name: Not on file   Number of children: Not on file   Years of education: Not on file   Highest education level: Not on file  Occupational History   Not on file  Tobacco Use   Smoking status: Former   Smokeless tobacco: Never  Vaping Use   Vaping status: Never Used  Substance and Sexual Activity   Alcohol use: No    Alcohol/week: 0.0 standard  drinks of alcohol   Drug use: No   Sexual activity: Not on file  Other Topics Concern   Not on file  Social History Narrative   ** Merged History Encounter **       Social Determinants of Health   Financial Resource Strain: Not on file  Food Insecurity: Not on file  Transportation Needs: Not on file  Physical Activity: Not on file  Stress: Not on file  Social Connections: Not on file  Intimate Partner Violence: Not on file    Family History  Problem Relation Age of Onset   Hypertension Other    Prostate cancer Father     Allergies  Allergen Reactions   Diamox [Acetazolamide]     GI upset    Review of Systems  Constitutional: Negative.   HENT: Negative.    Eyes: Negative.   Respiratory: Negative.  Negative for shortness of breath.   Cardiovascular: Negative.  Negative for chest pain.  Gastrointestinal: Negative.  Negative for abdominal pain, constipation and diarrhea.  Genitourinary: Negative.   Musculoskeletal:  Negative for joint pain and myalgias.  Skin: Negative.   Neurological: Negative.  Negative for dizziness and headaches.  Endo/Heme/Allergies: Negative.   All other systems reviewed and are negative.      Objective:   BP 125/70   Pulse 60   Ht 5\' 6"  (1.676 m)   Wt 127 lb 12.8 oz (58 kg)   SpO2 98%   BMI 20.63  kg/m   Vitals:   07/21/23 1453  BP: 125/70  Pulse: 60  Height: 5\' 6"  (1.676 m)  Weight: 127 lb 12.8 oz (58 kg)  SpO2: 98%  BMI (Calculated): 20.64    Physical Exam Nursing note reviewed.  Constitutional:      Appearance: Normal appearance. He is normal weight.  HENT:     Head: Normocephalic and atraumatic.     Nose: Nose normal.     Mouth/Throat:     Mouth: Mucous membranes are moist.     Pharynx: Oropharynx is clear.  Eyes:     Extraocular Movements: Extraocular movements intact.     Conjunctiva/sclera: Conjunctivae normal.     Pupils: Pupils are equal, round, and reactive to light.  Cardiovascular:     Rate and Rhythm:  Normal rate and regular rhythm.     Pulses: Normal pulses.     Heart sounds: Normal heart sounds.  Pulmonary:     Effort: Pulmonary effort is normal.     Breath sounds: Normal breath sounds.  Abdominal:     General: Abdomen is flat. Bowel sounds are normal.     Palpations: Abdomen is soft.  Musculoskeletal:        General: Normal range of motion.     Cervical back: Normal range of motion.  Skin:    General: Skin is warm and dry.  Neurological:     General: No focal deficit present.     Mental Status: He is alert and oriented to person, place, and time.  Psychiatric:        Mood and Affect: Mood normal.        Behavior: Behavior normal.        Thought Content: Thought content normal.        Judgment: Judgment normal.      No results found for any visits on 07/21/23.  No results found for this or any previous visit (from the past 2160 hour(s)).    Assessment & Plan:   Problem List Items Addressed This Visit       Other   Elevated liver enzymes - Primary   Relevant Orders   CMP14+EGFR   Lipid screening   Relevant Orders   Lipid panel   CBC with Differential/Platelet   Other Visit Diagnoses     Other specified disorders of lens       Relevant Orders   CBC with Differential/Platelet       Return in about 1 year (around 07/20/2024).   Total time spent: 25 minutes  Google, NP  07/21/2023   This document may have been prepared by Dragon Voice Recognition software and as such may include unintentional dictation errors.

## 2023-07-28 ENCOUNTER — Other Ambulatory Visit: Payer: Medicare Other

## 2023-07-28 DIAGNOSIS — H278 Other specified disorders of lens: Secondary | ICD-10-CM | POA: Diagnosis not present

## 2023-07-28 DIAGNOSIS — Z131 Encounter for screening for diabetes mellitus: Secondary | ICD-10-CM

## 2023-07-28 DIAGNOSIS — Z13220 Encounter for screening for lipoid disorders: Secondary | ICD-10-CM

## 2023-07-28 DIAGNOSIS — R748 Abnormal levels of other serum enzymes: Secondary | ICD-10-CM

## 2023-07-28 DIAGNOSIS — Z1329 Encounter for screening for other suspected endocrine disorder: Secondary | ICD-10-CM | POA: Diagnosis not present

## 2023-07-29 LAB — CBC WITH DIFFERENTIAL/PLATELET
Basophils Absolute: 0.1 10*3/uL (ref 0.0–0.2)
Basos: 1 %
EOS (ABSOLUTE): 0.2 10*3/uL (ref 0.0–0.4)
Eos: 5 %
Hematocrit: 42.6 % (ref 37.5–51.0)
Hemoglobin: 14.2 g/dL (ref 13.0–17.7)
Immature Grans (Abs): 0 10*3/uL (ref 0.0–0.1)
Immature Granulocytes: 0 %
Lymphocytes Absolute: 1.5 10*3/uL (ref 0.7–3.1)
Lymphs: 29 %
MCH: 32 pg (ref 26.6–33.0)
MCHC: 33.3 g/dL (ref 31.5–35.7)
MCV: 96 fL (ref 79–97)
Monocytes Absolute: 0.5 10*3/uL (ref 0.1–0.9)
Monocytes: 9 %
Neutrophils Absolute: 2.9 10*3/uL (ref 1.4–7.0)
Neutrophils: 56 %
Platelets: 233 10*3/uL (ref 150–450)
RBC: 4.44 x10E6/uL (ref 4.14–5.80)
RDW: 14.2 % (ref 11.6–15.4)
WBC: 5.2 10*3/uL (ref 3.4–10.8)

## 2023-07-29 LAB — CMP14+EGFR
ALT: 9 IU/L (ref 0–44)
AST: 16 IU/L (ref 0–40)
Albumin: 4.1 g/dL (ref 3.7–4.7)
Alkaline Phosphatase: 147 IU/L — ABNORMAL HIGH (ref 44–121)
BUN/Creatinine Ratio: 15 (ref 10–24)
BUN: 31 mg/dL — ABNORMAL HIGH (ref 8–27)
Bilirubin Total: 0.4 mg/dL (ref 0.0–1.2)
CO2: 18 mmol/L — ABNORMAL LOW (ref 20–29)
Calcium: 9 mg/dL (ref 8.6–10.2)
Chloride: 112 mmol/L — ABNORMAL HIGH (ref 96–106)
Creatinine, Ser: 2.11 mg/dL — ABNORMAL HIGH (ref 0.76–1.27)
Globulin, Total: 2.8 g/dL (ref 1.5–4.5)
Glucose: 87 mg/dL (ref 70–99)
Potassium: 5.2 mmol/L (ref 3.5–5.2)
Sodium: 142 mmol/L (ref 134–144)
Total Protein: 6.9 g/dL (ref 6.0–8.5)
eGFR: 30 mL/min/{1.73_m2} — ABNORMAL LOW (ref >59)

## 2023-07-29 LAB — HEMOGLOBIN A1C
Est. average glucose Bld gHb Est-mCnc: 108 mg/dL
Hgb A1c MFr Bld: 5.4 % (ref 4.8–5.6)

## 2023-07-29 LAB — LIPID PANEL
Chol/HDL Ratio: 4.7 ratio (ref 0.0–5.0)
Cholesterol, Total: 108 mg/dL (ref 100–199)
HDL: 23 mg/dL — ABNORMAL LOW (ref >39)
LDL Chol Calc (NIH): 47 mg/dL (ref 0–99)
Triglycerides: 239 mg/dL — ABNORMAL HIGH (ref 0–149)
VLDL Cholesterol Cal: 38 mg/dL (ref 5–40)

## 2023-08-15 DIAGNOSIS — N1832 Chronic kidney disease, stage 3b: Secondary | ICD-10-CM | POA: Diagnosis not present

## 2023-08-15 DIAGNOSIS — E8722 Chronic metabolic acidosis: Secondary | ICD-10-CM | POA: Diagnosis not present

## 2023-08-18 DIAGNOSIS — H5440 Blindness, one eye, unspecified eye: Secondary | ICD-10-CM | POA: Diagnosis not present

## 2023-08-18 DIAGNOSIS — H34812 Central retinal vein occlusion, left eye, with macular edema: Secondary | ICD-10-CM | POA: Diagnosis not present

## 2023-08-18 DIAGNOSIS — Z961 Presence of intraocular lens: Secondary | ICD-10-CM | POA: Diagnosis not present

## 2023-08-18 DIAGNOSIS — H409 Unspecified glaucoma: Secondary | ICD-10-CM | POA: Diagnosis not present

## 2023-10-14 ENCOUNTER — Ambulatory Visit (INDEPENDENT_AMBULATORY_CARE_PROVIDER_SITE_OTHER): Payer: Medicare Other | Admitting: Cardiology

## 2023-10-14 DIAGNOSIS — Z23 Encounter for immunization: Secondary | ICD-10-CM

## 2024-02-14 DIAGNOSIS — R809 Proteinuria, unspecified: Secondary | ICD-10-CM | POA: Diagnosis not present

## 2024-02-14 DIAGNOSIS — N1832 Chronic kidney disease, stage 3b: Secondary | ICD-10-CM | POA: Diagnosis not present

## 2024-02-14 DIAGNOSIS — I129 Hypertensive chronic kidney disease with stage 1 through stage 4 chronic kidney disease, or unspecified chronic kidney disease: Secondary | ICD-10-CM | POA: Diagnosis not present

## 2024-02-16 DIAGNOSIS — H540X55 Blindness right eye category 5, blindness left eye category 5: Secondary | ICD-10-CM | POA: Diagnosis not present

## 2024-02-16 DIAGNOSIS — H34812 Central retinal vein occlusion, left eye, with macular edema: Secondary | ICD-10-CM | POA: Diagnosis not present

## 2024-02-16 DIAGNOSIS — H4052X3 Glaucoma secondary to other eye disorders, left eye, severe stage: Secondary | ICD-10-CM | POA: Diagnosis not present

## 2024-03-01 DIAGNOSIS — H409 Unspecified glaucoma: Secondary | ICD-10-CM | POA: Diagnosis not present

## 2024-03-01 DIAGNOSIS — H4052X3 Glaucoma secondary to other eye disorders, left eye, severe stage: Secondary | ICD-10-CM | POA: Diagnosis not present

## 2024-03-01 DIAGNOSIS — H540X55 Blindness right eye category 5, blindness left eye category 5: Secondary | ICD-10-CM | POA: Diagnosis not present

## 2024-04-19 ENCOUNTER — Ambulatory Visit: Admitting: Cardiology

## 2024-04-19 ENCOUNTER — Encounter: Payer: Self-pay | Admitting: Cardiology

## 2024-04-19 VITALS — BP 110/56 | HR 50 | Ht 66.0 in | Wt 126.0 lb

## 2024-04-19 DIAGNOSIS — Z0130 Encounter for examination of blood pressure without abnormal findings: Secondary | ICD-10-CM

## 2024-04-19 DIAGNOSIS — R600 Localized edema: Secondary | ICD-10-CM | POA: Diagnosis not present

## 2024-04-19 NOTE — Progress Notes (Signed)
 Established Patient Office Visit  Subjective:  Patient ID: William Holden, male    DOB: 1934-11-27  Age: 89 y.o. MRN: [id number]  Chief Complaint  Patient presents with   Follow-up    Bilateral Swelling Lower Legs and Feet    Patient in office for follow up, complaining of bilateral edema of feet and lower legs. Son is interpretor today. Son states ankles swell intermittently. Today, no edema noted bilaterally. Discussed elevating legs when sitting, compression hose. Patient seen by nephrology in April 2025, kidney function stable. Discussed notifying nephrology if edema worsens.     No other concerns at this time.   Past Medical History:  Diagnosis Date   Bowel obstruction (HCC)    CKD (chronic kidney disease), stage III (HCC)    Glaucoma     Past Surgical History:  Procedure Laterality Date   CATARACT EXTRACTION W/PHACO Right 02/09/2021   Procedure: CATARACT EXTRACTION PHACO AND INTRAOCULAR LENS PLACEMENT (IOC) RIGHT AHMED TUBE SHUNT W/ TUTOPLAST 10.48 01:00.9;  Surgeon: Rosa College, MD;  Location: [facility];  Service: Ophthalmology;  Laterality: Right;  60 MINUTES PER JODI   CATARACT EXTRACTION W/PHACO Left 03/14/2023   Procedure: CATARACT EXTRACTION PHACO AND INTRAOCULAR LENS PLACEMENT (IOC) LEFT;  Surgeon: Rosa College, MD;  Location: [facility];  Service: Ophthalmology;  Laterality: Left;  12.51 1:03.6   CHOLECYSTECTOMY     ENDOSCOPIC RETROGRADE CHOLANGIOPANCREATOGRAPHY (ERCP) WITH PROPOFOL  N/A 10/12/2015   Procedure: ENDOSCOPIC RETROGRADE CHOLANGIOPANCREATOGRAPHY (ERCP) WITH PROPOFOL ;  Surgeon: Marnee Sink, MD;  Location: [facility] END[facility];  Service: Endoscopy;  Laterality: N/A;   PROSTATE SURGERY      Social History   Socioeconomic History   Marital status: Single    Spouse name: Not on file   Number of children: Not on file   Years of education: Not on file   Highest education level: Not on file  Occupational History   Not on  file  Tobacco Use   Smoking status: Former   Smokeless tobacco: Never  Vaping Use   Vaping status: Never Used  Substance and Sexual Activity   Alcohol use: No    Alcohol/week: 0.0 standard drinks of alcohol   Drug use: No   Sexual activity: Not on file  Other Topics Concern   Not on file  Social History Narrative   ** Merged History Encounter **       Social Drivers of Corporate investment banker Strain: Not on file  Food Insecurity: Not on file  Transportation Needs: Not on file  Physical Activity: Not on file  Stress: Not on file  Social Connections: Not on file  Intimate Partner Violence: Not on file    Family History  Problem Relation Age of Onset   Hypertension Other    Prostate cancer Father     Allergies  Allergen Reactions   Diamox [Acetazolamide]     GI upset    Outpatient Medications Prior to Visit  Medication Sig   Multiple Vitamin (MULTIVITAMIN WITH MINERALS) TABS tablet Take 1 tablet by mouth daily.   No facility-administered medications prior to visit.    Review of Systems  Constitutional: Negative.   HENT: Negative.    Eyes: Negative.   Respiratory: Negative.  Negative for shortness of breath.   Cardiovascular:  Positive for leg swelling. Negative for chest pain.  Gastrointestinal: Negative.  Negative for abdominal pain, constipation and diarrhea.  Genitourinary: Negative.   Musculoskeletal:  Negative for joint pain and myalgias.  Skin:  Negative.   Neurological: Negative.  Negative for dizziness and headaches.  Endo/Heme/Allergies: Negative.   All other systems reviewed and are negative.      Objective:   BP (!) 110/56   Pulse (!) 50   Ht 5' 6 (1.676 m)   Wt 126 lb (57.2 kg)   SpO2 98%   BMI 20.34 kg/m   Vitals:   04/19/24 0938  BP: (!) 110/56  Pulse: (!) 50  Height: 5' 6 (1.676 m)  Weight: 126 lb (57.2 kg)  SpO2: 98%  BMI (Calculated): 20.35    Physical Exam Nursing note reviewed.  Constitutional:       Appearance: Normal appearance. He is normal weight.  HENT:     Head: Normocephalic and atraumatic.     Nose: Nose normal.     Mouth/Throat:     Mouth: Mucous membranes are moist.     Pharynx: Oropharynx is clear.   Eyes:     Extraocular Movements: Extraocular movements intact.     Conjunctiva/sclera: Conjunctivae normal.     Pupils: Pupils are equal, round, and reactive to light.    Cardiovascular:     Rate and Rhythm: Normal rate and regular rhythm.     Pulses: Normal pulses.     Heart sounds: Normal heart sounds.  Pulmonary:     Effort: Pulmonary effort is normal.     Breath sounds: Normal breath sounds.  Abdominal:     General: Abdomen is flat. Bowel sounds are normal.     Palpations: Abdomen is soft.   Musculoskeletal:        General: No swelling. Normal range of motion.     Cervical back: Normal range of motion.     Right lower leg: Normal. No edema.     Left lower leg: Normal. No edema.   Skin:    General: Skin is warm and dry.   Neurological:     General: No focal deficit present.     Mental Status: He is alert and oriented to person, place, and time.   Psychiatric:        Mood and Affect: Mood normal.        Behavior: Behavior normal.        Thought Content: Thought content normal.        Judgment: Judgment normal.      No results found for any visits on 04/19/24.  No results found for this or any previous visit (from the past 2160 hours).    Assessment & Plan:  Elevate legs when sitting Compression hose Notify nephrology if edema worsens  Problem List Items Addressed This Visit       Other   Bilateral lower extremity edema - Primary    Return if symptoms worsen or fail to improve, for as scheduled.   Total time spent: 25 minutes  Google, NP  04/19/2024   This document may have been prepared by Dragon Voice Recognition software and as such may include unintentional dictation errors.

## 2024-06-28 DIAGNOSIS — H409 Unspecified glaucoma: Secondary | ICD-10-CM | POA: Diagnosis not present

## 2024-07-20 ENCOUNTER — Ambulatory Visit
Admission: RE | Admit: 2024-07-20 | Discharge: 2024-07-20 | Disposition: A | Discharge status: Home / Self Care | Source: Ambulatory Visit | Attending: Cardiology

## 2024-07-20 ENCOUNTER — Ambulatory Visit: Payer: Medicare Other | Admitting: Cardiology

## 2024-07-20 ENCOUNTER — Encounter: Payer: Self-pay | Admitting: Cardiology

## 2024-07-20 ENCOUNTER — Ambulatory Visit
Admission: RE | Admit: 2024-07-20 | Discharge: 2024-07-20 | Disposition: A | Discharge status: Home / Self Care | Attending: Cardiology | Admitting: Cardiology

## 2024-07-20 VITALS — BP 120/70 | HR 45 | Ht 66.0 in | Wt 128.2 lb

## 2024-07-20 DIAGNOSIS — M79631 Pain in right forearm: Secondary | ICD-10-CM | POA: Diagnosis not present

## 2024-07-20 DIAGNOSIS — R600 Localized edema: Secondary | ICD-10-CM

## 2024-07-20 DIAGNOSIS — M79601 Pain in right arm: Secondary | ICD-10-CM | POA: Insufficient documentation

## 2024-07-20 DIAGNOSIS — M19021 Primary osteoarthritis, right elbow: Secondary | ICD-10-CM | POA: Diagnosis not present

## 2024-07-20 DIAGNOSIS — M7989 Other specified soft tissue disorders: Secondary | ICD-10-CM | POA: Diagnosis not present

## 2024-07-20 MED ORDER — ERYTHROMYCIN 5 MG/GM OP OINT: 1.0000 | TOPICAL_OINTMENT | Freq: Every day | OPHTHALMIC | 2 refills | Status: AC

## 2024-07-20 NOTE — Progress Notes (Signed)
 Established Patient Office Visit  Subjective:  Patient ID: William Holden, male    DOB: [birth year]  Age: 89 y.o. MRN: [id number]  Chief Complaint  Patient presents with   Follow-up    1 year follow up. Right forearm is a little swollen started last week he slide on his bed. He can not straighten the arm fully. He says he has had forearm swelling before when he lived in [place]. Also having problems with both feet swelling.     Patient in office for yearly follow up. Son serves as Engineer, technical sales. Multiple complaints. Patient complains of right arm pain after a fall two weeks ago. Arm was swollen, was taking Tylenol for pain. Will get an xray to rule out fracture.  Son states patient's ankles swell if he sits too long. When he gets up and walks, feet improve. Recommend elevating legs when sitting.     No other concerns at this time.   Past Medical History:  Diagnosis Date   Bowel obstruction (HCC)    CKD (chronic kidney disease), stage III (HCC)    Glaucoma     Past Surgical History:  Procedure Laterality Date   CATARACT EXTRACTION W/PHACO Right 02/09/2021   Procedure: CATARACT EXTRACTION PHACO AND INTRAOCULAR LENS PLACEMENT (IOC) RIGHT AHMED TUBE SHUNT W/ TUTOPLAST 10.48 01:00.9;  Surgeon: Myrna Adine Anes, MD;  Location: [facility];  Service: Ophthalmology;  Laterality: Right;  60 MINUTES PER JODI   CATARACT EXTRACTION W/PHACO Left 03/14/2023   Procedure: CATARACT EXTRACTION PHACO AND INTRAOCULAR LENS PLACEMENT (IOC) LEFT;  Surgeon: Myrna Adine Anes, MD;  Location: [facility];  Service: Ophthalmology;  Laterality: Left;  12.51 1:03.6   CHOLECYSTECTOMY     ENDOSCOPIC RETROGRADE CHOLANGIOPANCREATOGRAPHY (ERCP) WITH PROPOFOL  N/A 10/12/2015   Procedure: ENDOSCOPIC RETROGRADE CHOLANGIOPANCREATOGRAPHY (ERCP) WITH PROPOFOL ;  Surgeon: Rogelia Copping, MD;  Location: [facility];  Service: Endoscopy;  Laterality: N/A;   PROSTATE SURGERY      Social History    Socioeconomic History   Marital status: Single    Spouse name: Not on file   Number of children: Not on file   Years of education: Not on file   Highest education level: Not on file  Occupational History   Not on file  Tobacco Use   Smoking status: Former   Smokeless tobacco: Never  Vaping Use   Vaping status: Never Used  Substance and Sexual Activity   Alcohol use: No    Alcohol/week: 0.0 standard drinks of alcohol   Drug use: No   Sexual activity: Not on file  Other Topics Concern   Not on file  Social History Narrative   ** Merged History Encounter **       Social Drivers of Corporate investment banker Strain: Not on file  Food Insecurity: Not on file  Transportation Needs: Not on file  Physical Activity: Not on file  Stress: Not on file  Social Connections: Not on file  Intimate Partner Violence: Not on file    Family History  Problem Relation Age of Onset   Hypertension Other    Prostate cancer Father     Allergies  Allergen Reactions   Diamox [Acetazolamide]     GI upset    Outpatient Medications Prior to Visit  Medication Sig   Multiple Vitamin (MULTIVITAMIN WITH MINERALS) TABS tablet Take 1 tablet by mouth daily.   [DISCONTINUED] erythromycin  ophthalmic ointment 1 Application at bedtime.   No facility-administered medications prior to visit.  Review of Systems  Constitutional: Negative.   HENT: Negative.    Eyes: Negative.   Respiratory: Negative.  Negative for shortness of breath.   Cardiovascular:  Positive for leg swelling. Negative for chest pain.  Gastrointestinal: Negative.  Negative for abdominal pain, constipation and diarrhea.  Genitourinary: Negative.   Musculoskeletal:  Negative for joint pain and myalgias.  Skin: Negative.   Neurological: Negative.  Negative for dizziness and headaches.  Endo/Heme/Allergies: Negative.   All other systems reviewed and are negative.      Objective:   BP 120/70   Pulse (!) 45   Ht 5'  6 (1.676 m)   Wt 128 lb 3.2 oz (58.2 kg)   SpO2 98%   BMI 20.69 kg/m   Vitals:   07/20/24 1427  BP: 120/70  Pulse: (!) 45  Height: 5' 6 (1.676 m)  Weight: 128 lb 3.2 oz (58.2 kg)  SpO2: 98%  BMI (Calculated): 20.7    Physical Exam Nursing note reviewed.  Constitutional:      Appearance: Normal appearance. He is normal weight.  HENT:     Head: Normocephalic and atraumatic.     Nose: Nose normal.     Mouth/Throat:     Mouth: Mucous membranes are moist.     Pharynx: Oropharynx is clear.  Eyes:     Extraocular Movements: Extraocular movements intact.     Conjunctiva/sclera: Conjunctivae normal.     Pupils: Pupils are equal, round, and reactive to light.  Cardiovascular:     Rate and Rhythm: Normal rate and regular rhythm.     Pulses: Normal pulses.     Heart sounds: Normal heart sounds.  Pulmonary:     Effort: Pulmonary effort is normal.     Breath sounds: Normal breath sounds.  Abdominal:     General: Abdomen is flat. Bowel sounds are normal.     Palpations: Abdomen is soft.  Musculoskeletal:        General: Normal range of motion.     Cervical back: Normal range of motion.  Skin:    General: Skin is warm and dry.  Neurological:     General: No focal deficit present.     Mental Status: He is alert and oriented to person, place, and time.  Psychiatric:        Mood and Affect: Mood normal.        Behavior: Behavior normal.        Thought Content: Thought content normal.        Judgment: Judgment normal.      No results found for any visits on 07/20/24.  No results found for this or any previous visit (from the past 2160 hours).    Assessment & Plan:  Right arm xray today Elevate legs when sitting  Problem List Items Addressed This Visit       Other   Bilateral lower extremity edema - Primary   Right arm pain   Relevant Orders   DG Forearm Right    Return in about 1 year (around 07/20/2025).   Total time spent: 25 minutes  Google,  NP  07/20/2024   This document may have been prepared by Dragon Voice Recognition software and as such may include unintentional dictation errors.

## 2024-07-26 ENCOUNTER — Ambulatory Visit: Payer: Self-pay | Admitting: Cardiology

## 2024-07-26 NOTE — Progress Notes (Signed)
Pt informed

## 2024-08-16 ENCOUNTER — Ambulatory Visit (INDEPENDENT_AMBULATORY_CARE_PROVIDER_SITE_OTHER): Admitting: Cardiology

## 2024-08-16 DIAGNOSIS — Z23 Encounter for immunization: Secondary | ICD-10-CM

## 2024-08-21 DIAGNOSIS — N1832 Chronic kidney disease, stage 3b: Secondary | ICD-10-CM | POA: Diagnosis not present

## 2024-08-21 DIAGNOSIS — I129 Hypertensive chronic kidney disease with stage 1 through stage 4 chronic kidney disease, or unspecified chronic kidney disease: Secondary | ICD-10-CM | POA: Diagnosis not present

## 2024-08-21 DIAGNOSIS — E785 Hyperlipidemia, unspecified: Secondary | ICD-10-CM | POA: Diagnosis not present

## 2024-08-21 DIAGNOSIS — R809 Proteinuria, unspecified: Secondary | ICD-10-CM | POA: Diagnosis not present

## 2024-08-30 DIAGNOSIS — H409 Unspecified glaucoma: Secondary | ICD-10-CM | POA: Diagnosis not present

## 2024-08-30 DIAGNOSIS — Z961 Presence of intraocular lens: Secondary | ICD-10-CM | POA: Diagnosis not present

## 2024-08-30 DIAGNOSIS — H540X55 Blindness right eye category 5, blindness left eye category 5: Secondary | ICD-10-CM | POA: Diagnosis not present

## 2024-08-30 DIAGNOSIS — H4052X3 Glaucoma secondary to other eye disorders, left eye, severe stage: Secondary | ICD-10-CM | POA: Diagnosis not present

## 2024-10-11 DIAGNOSIS — N1832 Chronic kidney disease, stage 3b: Secondary | ICD-10-CM | POA: Diagnosis not present

## 2024-10-11 DIAGNOSIS — I129 Hypertensive chronic kidney disease with stage 1 through stage 4 chronic kidney disease, or unspecified chronic kidney disease: Secondary | ICD-10-CM | POA: Diagnosis not present

## 2024-10-11 DIAGNOSIS — E785 Hyperlipidemia, unspecified: Secondary | ICD-10-CM | POA: Diagnosis not present

## 2024-10-11 DIAGNOSIS — R809 Proteinuria, unspecified: Secondary | ICD-10-CM | POA: Diagnosis not present

## 2025-07-12 ENCOUNTER — Ambulatory Visit: Admitting: Cardiology
